# Patient Record
Sex: Male | Born: 1965 | Race: White | Hispanic: No | Marital: Married | Smoking: Never smoker
Health system: Southern US, Community
[De-identification: ages and names within clinical notes are randomized; demographics above are authoritative.]

## PROBLEM LIST (undated history)

## (undated) DIAGNOSIS — F32A Depression, unspecified: Secondary | ICD-10-CM

## (undated) DIAGNOSIS — IMO0001 Reserved for inherently not codable concepts without codable children: Secondary | ICD-10-CM

## (undated) DIAGNOSIS — K219 Gastro-esophageal reflux disease without esophagitis: Secondary | ICD-10-CM

## (undated) DIAGNOSIS — K5900 Constipation, unspecified: Secondary | ICD-10-CM

## (undated) DIAGNOSIS — E7800 Pure hypercholesterolemia, unspecified: Secondary | ICD-10-CM

## (undated) DIAGNOSIS — I10 Essential (primary) hypertension: Secondary | ICD-10-CM

## (undated) DIAGNOSIS — Z8739 Personal history of other diseases of the musculoskeletal system and connective tissue: Secondary | ICD-10-CM

## (undated) DIAGNOSIS — Z87442 Personal history of urinary calculi: Secondary | ICD-10-CM

## (undated) DIAGNOSIS — G4730 Sleep apnea, unspecified: Secondary | ICD-10-CM

## (undated) DIAGNOSIS — F329 Major depressive disorder, single episode, unspecified: Secondary | ICD-10-CM

## (undated) HISTORY — PX: ROTATOR CUFF REPAIR: SHX139

## (undated) HISTORY — PX: CYSTO: SHX6284

---

## 2004-06-30 ENCOUNTER — Emergency Department (HOSPITAL_COMMUNITY): Admission: AD | Admit: 2004-06-30 | Discharge: 2004-06-30 | Payer: Self-pay | Admitting: Family Medicine

## 2010-06-03 ENCOUNTER — Emergency Department (HOSPITAL_BASED_OUTPATIENT_CLINIC_OR_DEPARTMENT_OTHER)
Admission: EM | Admit: 2010-06-03 | Discharge: 2010-06-03 | Disposition: A | Discharge status: Home / Self Care | Payer: Managed Care, Other (non HMO) | Attending: Emergency Medicine | Admitting: Emergency Medicine

## 2010-06-03 DIAGNOSIS — L02419 Cutaneous abscess of limb, unspecified: Secondary | ICD-10-CM | POA: Insufficient documentation

## 2010-06-03 DIAGNOSIS — Z79899 Other long term (current) drug therapy: Secondary | ICD-10-CM | POA: Insufficient documentation

## 2010-06-03 DIAGNOSIS — I10 Essential (primary) hypertension: Secondary | ICD-10-CM | POA: Insufficient documentation

## 2010-06-03 DIAGNOSIS — E785 Hyperlipidemia, unspecified: Secondary | ICD-10-CM | POA: Insufficient documentation

## 2010-06-03 LAB — DIFFERENTIAL
Basophils Absolute: 0 10*3/uL (ref 0.0–0.1)
Eosinophils Relative: 1 % (ref 0–5)
Lymphocytes Relative: 12 % (ref 12–46)
Monocytes Absolute: 1.2 10*3/uL — ABNORMAL HIGH (ref 0.1–1.0)
Monocytes Relative: 8 % (ref 3–12)
Neutro Abs: 13.1 10*3/uL — ABNORMAL HIGH (ref 1.7–7.7)

## 2010-06-03 LAB — CBC
HCT: 42.9 % (ref 39.0–52.0)
Hemoglobin: 15.3 g/dL (ref 13.0–17.0)
MCH: 29 pg (ref 26.0–34.0)
MCHC: 35.7 g/dL (ref 30.0–36.0)
RDW: 12.9 % (ref 11.5–15.5)

## 2010-06-03 LAB — BASIC METABOLIC PANEL
BUN: 21 mg/dL (ref 6–23)
CO2: 28 mEq/L (ref 19–32)
Calcium: 9.5 mg/dL (ref 8.4–10.5)
Creatinine, Ser: 1 mg/dL (ref 0.4–1.5)
GFR calc non Af Amer: >60 mL/min (ref >60)
Glucose, Bld: 131 mg/dL — ABNORMAL HIGH (ref 70–99)

## 2011-10-01 ENCOUNTER — Other Ambulatory Visit: Payer: Self-pay | Admitting: Orthopedic Surgery

## 2011-10-01 DIAGNOSIS — M25511 Pain in right shoulder: Secondary | ICD-10-CM

## 2011-10-05 ENCOUNTER — Ambulatory Visit
Admission: RE | Admit: 2011-10-05 | Discharge: 2011-10-05 | Disposition: A | Discharge status: Home / Self Care | Payer: Managed Care, Other (non HMO) | Source: Ambulatory Visit | Attending: Orthopedic Surgery | Admitting: Orthopedic Surgery

## 2011-10-05 DIAGNOSIS — M25511 Pain in right shoulder: Secondary | ICD-10-CM

## 2012-02-08 ENCOUNTER — Emergency Department (HOSPITAL_BASED_OUTPATIENT_CLINIC_OR_DEPARTMENT_OTHER): Payer: Managed Care, Other (non HMO)

## 2012-02-08 ENCOUNTER — Emergency Department (HOSPITAL_BASED_OUTPATIENT_CLINIC_OR_DEPARTMENT_OTHER)
Admission: EM | Admit: 2012-02-08 | Discharge: 2012-02-08 | Disposition: A | Discharge status: Home / Self Care | Payer: Managed Care, Other (non HMO) | Attending: Emergency Medicine | Admitting: Emergency Medicine

## 2012-02-08 ENCOUNTER — Encounter (HOSPITAL_BASED_OUTPATIENT_CLINIC_OR_DEPARTMENT_OTHER): Payer: Self-pay | Admitting: *Deleted

## 2012-02-08 DIAGNOSIS — M1990 Unspecified osteoarthritis, unspecified site: Secondary | ICD-10-CM

## 2012-02-08 DIAGNOSIS — I10 Essential (primary) hypertension: Secondary | ICD-10-CM | POA: Insufficient documentation

## 2012-02-08 DIAGNOSIS — K219 Gastro-esophageal reflux disease without esophagitis: Secondary | ICD-10-CM | POA: Insufficient documentation

## 2012-02-08 DIAGNOSIS — M064 Inflammatory polyarthropathy: Secondary | ICD-10-CM | POA: Insufficient documentation

## 2012-02-08 DIAGNOSIS — F3289 Other specified depressive episodes: Secondary | ICD-10-CM | POA: Insufficient documentation

## 2012-02-08 DIAGNOSIS — F329 Major depressive disorder, single episode, unspecified: Secondary | ICD-10-CM | POA: Insufficient documentation

## 2012-02-08 DIAGNOSIS — Z79899 Other long term (current) drug therapy: Secondary | ICD-10-CM | POA: Insufficient documentation

## 2012-02-08 DIAGNOSIS — E7800 Pure hypercholesterolemia, unspecified: Secondary | ICD-10-CM | POA: Insufficient documentation

## 2012-02-08 DIAGNOSIS — Z7982 Long term (current) use of aspirin: Secondary | ICD-10-CM | POA: Insufficient documentation

## 2012-02-08 HISTORY — DX: Reserved for inherently not codable concepts without codable children: IMO0001

## 2012-02-08 HISTORY — DX: Depression, unspecified: F32.A

## 2012-02-08 HISTORY — DX: Major depressive disorder, single episode, unspecified: F32.9

## 2012-02-08 HISTORY — DX: Gastro-esophageal reflux disease without esophagitis: K21.9

## 2012-02-08 HISTORY — DX: Essential (primary) hypertension: I10

## 2012-02-08 HISTORY — DX: Pure hypercholesterolemia, unspecified: E78.00

## 2012-02-08 LAB — CBC WITH DIFFERENTIAL/PLATELET
Basophils Absolute: 0 10*3/uL (ref 0.0–0.1)
Basophils Relative: 0 % (ref 0–1)
Eosinophils Absolute: 0.1 10*3/uL (ref 0.0–0.7)
MCH: 29.5 pg (ref 26.0–34.0)
MCHC: 34.3 g/dL (ref 30.0–36.0)
Monocytes Relative: 10 % (ref 3–12)
Neutro Abs: 8.3 10*3/uL — ABNORMAL HIGH (ref 1.7–7.7)
Neutrophils Relative %: 72 % (ref 43–77)
Platelets: 225 10*3/uL (ref 150–400)
RDW: 13.8 % (ref 11.5–15.5)

## 2012-02-08 LAB — BASIC METABOLIC PANEL
BUN: 15 mg/dL (ref 6–23)
GFR calc Af Amer: >90 mL/min (ref >90)
GFR calc non Af Amer: >90 mL/min (ref >90)
Potassium: 4.2 mEq/L (ref 3.5–5.1)
Sodium: 137 mEq/L (ref 135–145)

## 2012-02-08 LAB — D-DIMER, QUANTITATIVE: D-Dimer, Quant: 0.32 ug/mL-FEU (ref 0.00–0.48)

## 2012-02-08 MED ORDER — HYDROCODONE-ACETAMINOPHEN 5-325 MG PO TABS
1.0000 | ORAL_TABLET | Freq: Once | ORAL | Status: AC
  Administered 2012-02-08: 1 via ORAL
  Filled 2012-02-08: qty 1

## 2012-02-08 MED ORDER — HYDROCODONE-ACETAMINOPHEN 5-325 MG PO TABS: 1.0000 | ORAL_TABLET | Freq: Four times a day (QID) | ORAL | Status: DC | PRN

## 2012-02-08 NOTE — ED Notes (Signed)
Pt currently in xray

## 2012-02-08 NOTE — ED Notes (Signed)
Pt states he woke up yest a.m. With right ankle pain. No known injury. Hurt worse while walking stairs. "[place] this [place] Concerned about clot. Swollen per pt.Nathan Moses

## 2012-02-08 NOTE — ED Notes (Signed)
Returned from xray

## 2012-02-08 NOTE — ED Provider Notes (Signed)
History  This chart was scribed for Shelda Jakes, MD by Erskine Emery, ED Scribe. This patient was seen in room MH03/MH03 and the patient's care was started at 18:48.   CSN: [id number]  Arrival date & time 02/08/12  5621   First MD Initiated Contact with Patient 02/08/12 1848      Chief Complaint  Patient presents with  . Ankle Pain    (Consider location/radiation/quality/duration/timing/severity/associated sxs/prior treatment) The history is provided by the patient. No language interpreter was used.  Nathan Moses is a 47 y.o. male who presents to the Emergency Department complaining of gradually worsening sharp right ankle pain that radiates up the calf since waking yesterday morning. Pt reports the pain is about a 3/10 when laying down but about a 10/10 when putting weight on it. Pt claims he did a lot of walking yesterday, which aggravates the pain. Pt reports some associated warmth in the ankle, diaphoresis, and nausea but denies any associated known injury to the area, chest pain, shortness of breath, emesis, diarrhea, dysuria, hematuria, headache, neck pain, or back pain. Pt has no personal or family h/o gout. Pt flew cross country this morning.  Past Medical History  Diagnosis Date  . Hypertension   . Hypercholesteremia   . Reflux   . Depression     Past Surgical History  Procedure Date  . Rotator cuff repair     History reviewed. No pertinent family history.  History  Substance Use Topics  . Smoking status: Never Smoker   . Smokeless tobacco: Not on file  . Alcohol Use: No      Review of Systems  Constitutional: Negative for fever and chills.  HENT: Negative for congestion and rhinorrhea.   Eyes: Negative for visual disturbance.  Respiratory: Negative for cough and shortness of breath.   Cardiovascular: Negative for chest pain.  Gastrointestinal: Negative for nausea, vomiting and diarrhea.  Genitourinary: Negative for dysuria and hematuria.   Musculoskeletal: Negative for back pain.       Right ankle pain and warmth.  Skin: Negative for rash.  Neurological: Negative for headaches.  Hematological: Does not bruise/bleed easily.  Psychiatric/Behavioral: Negative for sleep disturbance.  All other systems reviewed and are negative.    Allergies  Sulfa antibiotics  Home Medications   Current Outpatient Rx  Name  Route  Sig  Dispense  Refill  . ASPIRIN 81 MG PO TABS   Oral   Take 81 mg by mouth daily.         Marland Kitchen CITALOPRAM HYDROBROMIDE 40 MG PO TABS   Oral   Take 20 mg by mouth daily.          Marland Kitchen OMEPRAZOLE 40 MG PO CPDR   Oral   Take 20 mg by mouth daily.          Marland Kitchen SIMVASTATIN 20 MG PO TABS   Oral   Take 20 mg by mouth every evening.         . TRIAMTERENE 100 MG PO CAPS   Oral   Take 100 mg by mouth 2 (two) times daily.         . TRIAMTERENE-HCTZ 37.5-25 MG PO TABS   Oral   Take 1 tablet by mouth daily.         Marland Kitchen HYDROCODONE-ACETAMINOPHEN 5-325 MG PO TABS   Oral   Take 1-2 tablets by mouth every 6 (six) hours as needed for pain.   20 tablet   0     Triage Vitals:  BP 146/98  Pulse 96  Temp 98.3 F (36.8 C) (Oral)  Resp 20  Ht 5\' 9"  (1.753 m)  Wt 270 lb (122.471 kg)  BMI 39.87 kg/m2  SpO2 99%  Physical Exam  Nursing note and vitals reviewed. Constitutional: He is oriented to person, place, and time. He appears well-developed and well-nourished. No distress.  HENT:  Head: Normocephalic and atraumatic.  Eyes: EOM are normal. Pupils are equal, round, and reactive to light.  Neck: Neck supple. No tracheal deviation present.  Cardiovascular: Normal rate, regular rhythm and normal heart sounds.   No murmur heard.      DP pulse is 2+. Capillary refill is 2 seconds in right ankle.  Pulmonary/Chest: Effort normal and breath sounds normal. No respiratory distress. He has no wheezes.  Abdominal: Soft. Bowel sounds are normal. He exhibits no distension.  Musculoskeletal: Normal range of  motion.       Right ankle swelling and warmth with no redness, no tenderness to the calf, no sweling in the legs, and no swelling at the knee.  Neurological: He is alert and oriented to person, place, and time. No cranial nerve deficit. Coordination normal.  Skin: Skin is warm and dry.  Psychiatric: He has a normal mood and affect.    ED Course  Procedures (including critical care time) DIAGNOSTIC STUDIES: Oxygen Saturation is 99% on room air, normal by my interpretation.    COORDINATION OF CARE: 19:10--I evaluated the patient and we discussed a treatment plan including ankle x-ray to which the pt agreed.   20:08--I rechecked the pt and explained to him the results of his labs and x-ray.  Results for orders placed during the hospital encounter of 02/08/12  CBC WITH DIFFERENTIAL      Component Value Range   WBC 11.4 (*) 4.0 - 10.5 K/uL   RBC 4.95  4.22 - 5.81 MIL/uL   Hemoglobin 14.6  13.0 - 17.0 g/dL   HCT 16.1  09.6 - 04.5 %   MCV 86.1  78.0 - 100.0 fL   MCH 29.5  26.0 - 34.0 pg   MCHC 34.3  30.0 - 36.0 g/dL   RDW 40.9  81.1 - 91.4 %   Platelets 225  150 - 400 K/uL   Neutrophils Relative 72  43 - 77 %   Neutro Abs 8.3 (*) 1.7 - 7.7 K/uL   Lymphocytes Relative 17  12 - 46 %   Lymphs Abs 1.9  0.7 - 4.0 K/uL   Monocytes Relative 10  3 - 12 %   Monocytes Absolute 1.1 (*) 0.1 - 1.0 K/uL   Eosinophils Relative 1  0 - 5 %   Eosinophils Absolute 0.1  0.0 - 0.7 K/uL   Basophils Relative 0  0 - 1 %   Basophils Absolute 0.0  0.0 - 0.1 K/uL  BASIC METABOLIC PANEL      Component Value Range   Sodium 137  135 - 145 mEq/L   Potassium 4.2  3.5 - 5.1 mEq/L   Chloride 98  96 - 112 mEq/L   CO2 26  19 - 32 mEq/L   Glucose, Bld 135 (*) 70 - 99 mg/dL   BUN 15  6 - 23 mg/dL   Creatinine, Ser 7.82  0.50 - 1.35 mg/dL   Calcium 9.5  8.4 - 95.6 mg/dL   GFR calc non Af Amer >90  >90 mL/min   GFR calc Af Amer >90  >90 mL/min  D-DIMER, QUANTITATIVE  Component Value Range   D-Dimer, Quant  0.32  0.00 - 0.48 ug/mL-FEU   Dg Ankle Complete Right  02/08/2012  *RADIOLOGY REPORT*  Clinical Data: Right ankle pain.  Swelling.  No injury.  RIGHT ANKLE - COMPLETE 3+ VIEW  Comparison: None.  Findings: Mild medial and lateral malleolar soft tissue swelling is present in the ankle.  The alignment is anatomic.  The ankle mortise is congruent.  Ankle effusion is present.  There is no osteochondral lesion of the talar dome.  Small calcaneal spurs are incidentally noted.  IMPRESSION: Ankle swelling and effusion without osseous abnormality.   Original Report Authenticated By: Andreas Newport, M.D.      1. Inflammatory arthritis       MDM  Patient clinically with inflammatory arthritis most likely of the right ankle no injury no fracture seen on x-ray. Also d-dimer negative so not consistent with a DVT. Clinically not consistent with a DVT either. Patient will be treated with pain medications this may be gout gout is a septic joint patient has no history of previous arthritic or gout problems. Patient given precautions about septic joint. Orthopedic followup provided. Will give crutches as needed. Patient will return for followup with orthopedics for worse pain redness fevers spreading of redness.      I personally performed the services described in this documentation, which was scribed in my presence. The recorded information has been reviewed and is accurate.     Shelda Jakes, MD 02/08/12 2028

## 2012-11-05 ENCOUNTER — Encounter (INDEPENDENT_AMBULATORY_CARE_PROVIDER_SITE_OTHER): Payer: Self-pay | Admitting: Surgery

## 2012-11-05 ENCOUNTER — Ambulatory Visit (INDEPENDENT_AMBULATORY_CARE_PROVIDER_SITE_OTHER): Payer: Commercial Indemnity | Admitting: Surgery

## 2012-11-05 ENCOUNTER — Other Ambulatory Visit (INDEPENDENT_AMBULATORY_CARE_PROVIDER_SITE_OTHER): Payer: Self-pay

## 2012-11-05 DIAGNOSIS — K2100 Gastro-esophageal reflux disease with esophagitis, without bleeding: Secondary | ICD-10-CM

## 2012-11-05 DIAGNOSIS — Z6841 Body Mass Index (BMI) 40.0 and over, adult: Secondary | ICD-10-CM

## 2012-11-05 LAB — CBC WITH DIFFERENTIAL/PLATELET
Basophils Relative: 1 % (ref 0–1)
Eosinophils Absolute: 0.3 10*3/uL (ref 0.0–0.7)
Lymphs Abs: 1.9 10*3/uL (ref 0.7–4.0)
MCH: 29.4 pg (ref 26.0–34.0)
Neutro Abs: 5.6 10*3/uL (ref 1.7–7.7)
Neutrophils Relative %: 65 % (ref 43–77)
Platelets: 301 10*3/uL (ref 150–400)
RBC: 5.61 MIL/uL (ref 4.22–5.81)

## 2012-11-05 LAB — T4: T4, Total: 8.8 ug/dL (ref 5.0–12.5)

## 2012-11-05 NOTE — Patient Instructions (Signed)
Sleeve Gastrectomy A sleeve gastrectomy is an operation that removes a large portion of your stomach. This operation is performed to help you lose weight. You lose weight with this operation because it restricts the amount of food you can eat. Your stomach will be a narrow tube after the operation (the size of a banana). Your stomach will hold much less food than your normal stomach. Also, the portion of your stomach that is removed produces a hormone that causes hunger. You are a candidate for this operation if you have morbid obesity, defined as a body mass index (BMI) greater than 40. You may also be a candidate if you have severe obesity related diseases such as: diabetes mellitus 2, obstructive sleep apnea, or cardiopulmonary disease (heart and lung) with a BMI greater than 35. You will need to talk with your surgeon and insurance company to find out if this surgery is right for you.  Sleeve gastrectomy is a good alternative to other treatments of obesity (bariatric) operations. It does not require any adjustments after the operation compared with an adjustable gastric band. Also, it is safer than a gastric bypass. RISKS AND COMPLICATIONS Some of the problems that can occur from this procedure include:  Infection. A germ starts growing in the incision sites. This can usually be treated with antibiotics.  Bleeding. This can occur with any surgery. Your surgeon will take all precautions to minimize this risk.  Damage to tissue or organs in the area may occur. If there is excessive damage, the surgeon may need to change to an open surgery. In this case, one large incision will be made in the center of your abdomen.  Leakage. The fluid in your stomach may leak into the abdominal cavity. If this happens, you may need another surgery to fix the leak. BEFORE THE PROCEDURE Before your operation you will meet with your surgeon and their team for the treatment of obesity. Here you will find out if you are  a candidate for bariatric surgery. The risks and the benefits of the operation will be explained. You will also meet a:  Dietician who will guide you with your preoperative and postoperative diet.  An internal medical doctor to manage your obesity related illnesses.  A psychology team to help with cravings or other mental difficulties. In addition:  You will be directed to have certain lab work and x-rays performed.  You will schedule a special test called a manometry. This test evaluates your esophagus and how it moves.  You will be placed on a special liquid diet two to three weeks before your operation. This diet helps you lose weight before the operation and decrease the amount of fat in the abdomen. It makes the operation easier for the surgeon and safer for the patient. The dietician will share the details of this with you. Before your operation:  Make sure you follow your surgeon's instructions exactly. Stop or continue medications they recommend.  Do not eat or drink anything after midnight.  Arrive at the hospital 1 hour before your surgery for check in.  Shower the morning of your operation. PROCEDURE  Most sleeve gastrectomies are performed using a laparoscope. A laparoscope is a thin, lighted, pencil-sized tube. Once you are anesthetized (asleep), the surgeon inflates your belly (abdomen) with a gas (carbon dioxide) that makes room to operate. It also makes your organs easier to see. The laparoscope is inserted into the abdomen through a small incision. Other small instruments are inserted into the abdomen  through other small incisions. During the operation, the stomach is divided using a stapler. Part of the stomach is removed through one of the incisions. The remaining stomach is reinforced using a stitch (suture) and surgical glue to prevent leakage of the gastric contents. At the end of the procedure, the gas is removed from the inside of your abdomen. The incisions are closed  with stitches. These may be covered with a dressing or left open. Because the incisions are small, there is usually minimal discomfort. You will wake up in a recovery room. Once your anesthesia has worn off, you will be moved to your hospital room. AFTER THE PROCEDURE  You will stay in the hospital, on average, for two days.  You will be given pain medication and anti-nausea medication.  You may have a drain from one of the incisions in your abdomen. This drain will stay in place until your first postoperative visit.  The nursing staff will assist you in getting out of bed the day of, or one day after, your surgery.  You will start on a liquid diet, the first day after your operation. The dietician will recommend this diet.  Taking deep breaths and coughing is very important to avoid pneumonia. Document Released: 10/20/2008 Document Revised: 03/17/2011 Document Reviewed: 10/20/2008 [facility] Patient Information 2014 [place], [place].

## 2012-11-05 NOTE — Progress Notes (Signed)
Chief Complaint:  Morbid obesity BMI of 43  History of Present Illness:  Nathan Moses is an 47 y.o. male from [place] who comes in with his wife on whom I did a ventral hernia repair. They had seen a seminar done by Arlys John and he is interested in a sleeve gastrectomy. I discussed that operation along with the others that we did hear in some detail. He would like to go ahead and 4 with a sleeve gastrectomy. Indicated the risk that she seems to know a lot about. He does have GERD and takes a PPI.  Past Medical History  Diagnosis Date  . Hypertension   . Hypercholesteremia   . Reflux   . Depression     Past Surgical History  Procedure Laterality Date  . Rotator cuff repair      Current Outpatient Prescriptions  Medication Sig Dispense Refill  . aspirin 81 MG tablet Take 81 mg by mouth daily.      . citalopram (CELEXA) 40 MG tablet Take 20 mg by mouth daily.       Marland Kitchen omeprazole (PRILOSEC) 40 MG capsule Take 20 mg by mouth daily.       . simvastatin (ZOCOR) 20 MG tablet Take 20 mg by mouth every evening.      . triamterene-hydrochlorothiazide (MAXZIDE-25) 37.5-25 MG per tablet Take 1 tablet by mouth daily.       No current facility-administered medications for this visit.   Sulfa antibiotics Family History  Problem Relation Age of Onset  . Cancer Maternal Grandmother     breast   Social History:   reports that he has never smoked. He does not have any smokeless tobacco history on file. He reports that he does not drink alcohol or use illicit drugs.   REVIEW OF SYSTEMS - PERTINENT POSITIVES ONLY: Positive for previous shoulder surgery. He's never had any problem with anesthesia. Denies DVT  Physical Exam:   Blood pressure 142/88, pulse 72, temperature 97.6 F (36.4 C), temperature source Temporal, resp. rate 16, height 5' 8.5" (1.74 m), weight 285 lb 9.6 oz (129.547 kg). Body mass index is 42.79 kg/(m^2).  Gen:  WDWN white male NAD  Neurological: Alert and oriented to  person, place, and time. Motor and sensory function is grossly intact  Head: Normocephalic and atraumatic.  Eyes: Conjunctivae are normal. Pupils are equal, round, and reactive to light. No scleral icterus.  Neck: Normal range of motion. Neck supple. No tracheal deviation or thyromegaly present.  Cardiovascular:  SR without murmurs or gallops.  No carotid bruits Respiratory: Effort normal.  No respiratory distress. No chest wall tenderness. Breath sounds normal.  No wheezes, rales or rhonchi.  Abdomen:  Obese caring his weight and his abdomen although he does have broad shoulders GU: Musculoskeletal: Normal range of motion. Extremities are nontender. No cyanosis, edema or clubbing noted Lymphadenopathy: No cervical, preauricular, postauricular or axillary adenopathy is present Skin: Skin is warm and dry. No rash noted. No diaphoresis. No erythema. No pallor. Pscyh: Normal mood and affect. Behavior is normal. Judgment and thought content normal.   LABORATORY RESULTS: No results found for this or any previous visit (from the past 48 hour(s)).  RADIOLOGY RESULTS: No results found.  Problem List: There are no active problems to display for this patient.   Assessment & Plan: Morbid obesity will begin workup for sleeve gastrectomy    Matt B. Daphine Deutscher, MD, [facility] [phone] beeper [phone]  11/05/2012 11:57 AM

## 2012-11-19 ENCOUNTER — Encounter (HOSPITAL_COMMUNITY)
Admission: RE | Disposition: A | Discharge status: Home / Self Care | Payer: Self-pay | Source: Ambulatory Visit | Attending: Surgery

## 2012-11-19 ENCOUNTER — Ambulatory Visit (HOSPITAL_COMMUNITY)
Admission: RE | Admit: 2012-11-19 | Discharge: 2012-11-19 | Disposition: A | Discharge status: Home / Self Care | Payer: Managed Care, Other (non HMO) | Source: Ambulatory Visit | Attending: Surgery | Admitting: Surgery

## 2012-11-19 HISTORY — PX: BREATH TEK H PYLORI: SHX5422

## 2012-11-19 SURGERY — BREATH TEST, FOR HELICOBACTER PYLORI

## 2012-11-22 ENCOUNTER — Other Ambulatory Visit: Payer: Self-pay

## 2012-11-22 ENCOUNTER — Ambulatory Visit (HOSPITAL_COMMUNITY)
Admission: RE | Admit: 2012-11-22 | Discharge: 2012-11-22 | Disposition: A | Discharge status: Home / Self Care | Payer: Managed Care, Other (non HMO) | Source: Ambulatory Visit | Attending: Surgery | Admitting: Surgery

## 2012-11-22 ENCOUNTER — Encounter (HOSPITAL_COMMUNITY): Payer: Self-pay | Admitting: Surgery

## 2012-11-22 DIAGNOSIS — I10 Essential (primary) hypertension: Secondary | ICD-10-CM | POA: Insufficient documentation

## 2012-11-22 DIAGNOSIS — K449 Diaphragmatic hernia without obstruction or gangrene: Secondary | ICD-10-CM | POA: Insufficient documentation

## 2012-11-22 DIAGNOSIS — F3289 Other specified depressive episodes: Secondary | ICD-10-CM | POA: Insufficient documentation

## 2012-11-22 DIAGNOSIS — E7800 Pure hypercholesterolemia, unspecified: Secondary | ICD-10-CM | POA: Insufficient documentation

## 2012-11-22 DIAGNOSIS — K219 Gastro-esophageal reflux disease without esophagitis: Secondary | ICD-10-CM | POA: Insufficient documentation

## 2012-11-22 DIAGNOSIS — F329 Major depressive disorder, single episode, unspecified: Secondary | ICD-10-CM | POA: Insufficient documentation

## 2012-11-22 DIAGNOSIS — Z6841 Body Mass Index (BMI) 40.0 and over, adult: Secondary | ICD-10-CM | POA: Insufficient documentation

## 2012-12-06 ENCOUNTER — Encounter: Payer: Self-pay | Admitting: Dietician

## 2012-12-06 ENCOUNTER — Encounter: Payer: Managed Care, Other (non HMO) | Attending: Surgery | Admitting: Dietician

## 2012-12-06 DIAGNOSIS — Z713 Dietary counseling and surveillance: Secondary | ICD-10-CM | POA: Insufficient documentation

## 2012-12-06 NOTE — Progress Notes (Signed)
  Pre-Op Assessment Visit:  Pre-Operative Sleeve Gastrectomy Surgery  Medical Nutrition Therapy:  Appt start time: 1545   End time:  1615.  Patient was seen on 12/06/12 for Pre-Operative Sleeve Gastrecomty Nutrition Assessment. Assessment and letter of approval faxed to [facility] Bariatric Surgery Program coordinator on 12/06/12.   Handouts given during visit include:  Pre-Op Goals Bariatric Surgery Protein Shakes Holiday Eating and Recipes After Bariatric Surgery  Patient to call the [facility] to enroll in Pre-Op and Post-Op Nutrition Education when surgery date is scheduled.

## 2012-12-06 NOTE — Patient Instructions (Signed)
Patient to call the [facility] to enroll in Pre-Op and Post-Op Nutrition Education when surgery date is scheduled. 

## 2012-12-28 ENCOUNTER — Other Ambulatory Visit (INDEPENDENT_AMBULATORY_CARE_PROVIDER_SITE_OTHER): Payer: Self-pay | Admitting: Surgery

## 2013-01-10 ENCOUNTER — Encounter (HOSPITAL_COMMUNITY): Payer: Self-pay | Admitting: Pharmacy Technician

## 2013-01-10 ENCOUNTER — Ambulatory Visit (HOSPITAL_COMMUNITY)
Admission: RE | Admit: 2013-01-10 | Discharge: 2013-01-10 | Disposition: A | Discharge status: Home / Self Care | Payer: Managed Care, Other (non HMO) | Source: Ambulatory Visit | Attending: Surgery | Admitting: Surgery

## 2013-01-10 ENCOUNTER — Encounter (HOSPITAL_COMMUNITY)
Admission: RE | Disposition: A | Discharge status: Home / Self Care | Payer: Self-pay | Source: Ambulatory Visit | Attending: Surgery

## 2013-01-10 DIAGNOSIS — Z01818 Encounter for other preprocedural examination: Secondary | ICD-10-CM | POA: Insufficient documentation

## 2013-01-10 HISTORY — PX: BREATH TEK H PYLORI: SHX5422

## 2013-01-10 SURGERY — BREATH TEST, FOR HELICOBACTER PYLORI

## 2013-01-11 ENCOUNTER — Encounter (HOSPITAL_COMMUNITY): Payer: Self-pay | Admitting: Surgery

## 2013-01-13 ENCOUNTER — Ambulatory Visit: Payer: Managed Care, Other (non HMO)

## 2013-01-13 NOTE — Patient Instructions (Addendum)
Elmus Buice  01/13/2013                           YOUR PROCEDURE IS SCHEDULED ON: 01/18/13               PLEASE REPORT TO SHORT STAY CENTER AT : 8:45 am               CALL THIS NUMBER IF ANY PROBLEMS THE DAY OF SURGERY :               [phone]                      REMEMBER:   Do not eat food or drink liquids AFTER MIDNIGHT .  Take these medicines the morning of surgery with A SIP OF WATER: NONE   Do not wear jewelry, make-up   Do not wear lotions, powders, or perfumes.   Do not shave legs or underarms 12 hrs. before surgery (men may shave face)  Do not bring valuables to the hospital.  Contacts, dentures or bridgework may not be worn into surgery.  Leave suitcase in the car. After surgery it may be brought to your room.  For patients admitted to the hospital more than one night, checkout time is 11:00                          The day of discharge.   Patients discharged the day of surgery will not be allowed to drive home                             If going home same day of surgery, must have someone stay with you first                           24 hrs at home and arrange for some one to drive you home from hospital.    Special Instructions:   Please read over the following fact sheets that you were given:                     1. [place]                       2. BRING C PAP [place]                                                X_____________________________________________________________________        Failure to follow these instructions may result in cancellation of your surgery

## 2013-01-14 ENCOUNTER — Encounter (HOSPITAL_COMMUNITY)
Admission: RE | Admit: 2013-01-14 | Discharge: 2013-01-14 | Disposition: A | Discharge status: Home / Self Care | Payer: Managed Care, Other (non HMO) | Source: Ambulatory Visit | Attending: Surgery | Admitting: Surgery

## 2013-01-14 ENCOUNTER — Telehealth (INDEPENDENT_AMBULATORY_CARE_PROVIDER_SITE_OTHER): Payer: Self-pay

## 2013-01-14 ENCOUNTER — Encounter (HOSPITAL_COMMUNITY): Payer: Self-pay

## 2013-01-14 ENCOUNTER — Encounter (INDEPENDENT_AMBULATORY_CARE_PROVIDER_SITE_OTHER): Payer: Self-pay | Admitting: Surgery

## 2013-01-14 ENCOUNTER — Ambulatory Visit (INDEPENDENT_AMBULATORY_CARE_PROVIDER_SITE_OTHER): Payer: Commercial Indemnity | Admitting: Surgery

## 2013-01-14 VITALS — BP 128/82 | HR 80 | Temp 98.4°F | Resp 15 | Ht 69.0 in | Wt 280.4 lb

## 2013-01-14 DIAGNOSIS — K219 Gastro-esophageal reflux disease without esophagitis: Secondary | ICD-10-CM

## 2013-01-14 DIAGNOSIS — Z6841 Body Mass Index (BMI) 40.0 and over, adult: Secondary | ICD-10-CM

## 2013-01-14 DIAGNOSIS — Z01812 Encounter for preprocedural laboratory examination: Secondary | ICD-10-CM | POA: Insufficient documentation

## 2013-01-14 HISTORY — DX: Constipation, unspecified: K59.00

## 2013-01-14 HISTORY — DX: Personal history of urinary calculi: Z87.442

## 2013-01-14 HISTORY — DX: Sleep apnea, unspecified: G47.30

## 2013-01-14 HISTORY — DX: Personal history of other diseases of the musculoskeletal system and connective tissue: Z87.39

## 2013-01-14 LAB — CBC WITH DIFFERENTIAL/PLATELET
BASOS PCT: 1 % (ref 0–1)
Basophils Absolute: 0.1 10*3/uL (ref 0.0–0.1)
Eosinophils Absolute: 0.3 10*3/uL (ref 0.0–0.7)
Eosinophils Relative: 4 % (ref 0–5)
HEMATOCRIT: 47.1 % (ref 39.0–52.0)
HEMOGLOBIN: 16.7 g/dL (ref 13.0–17.0)
LYMPHS ABS: 2 10*3/uL (ref 0.7–4.0)
LYMPHS PCT: 28 % (ref 12–46)
MCH: 29.6 pg (ref 26.0–34.0)
MCHC: 35.5 g/dL (ref 30.0–36.0)
MCV: 83.4 fL (ref 78.0–100.0)
MONO ABS: 0.7 10*3/uL (ref 0.1–1.0)
MONOS PCT: 10 % (ref 3–12)
NEUTROS ABS: 4.1 10*3/uL (ref 1.7–7.7)
Neutrophils Relative %: 57 % (ref 43–77)
Platelets: 266 10*3/uL (ref 150–400)
RBC: 5.65 MIL/uL (ref 4.22–5.81)
RDW: 13.5 % (ref 11.5–15.5)
WBC: 7.2 10*3/uL (ref 4.0–10.5)

## 2013-01-14 LAB — COMPREHENSIVE METABOLIC PANEL
ALT: 37 U/L (ref 0–53)
AST: 30 U/L (ref 0–37)
Albumin: 4.3 g/dL (ref 3.5–5.2)
Alkaline Phosphatase: 64 U/L (ref 39–117)
BUN: 23 mg/dL (ref 6–23)
CO2: 29 meq/L (ref 19–32)
CREATININE: 1.12 mg/dL (ref 0.50–1.35)
Calcium: 9.9 mg/dL (ref 8.4–10.5)
Chloride: 95 mEq/L — ABNORMAL LOW (ref 96–112)
GFR, EST AFRICAN AMERICAN: 88 mL/min — AB (ref >90)
GFR, EST NON AFRICAN AMERICAN: 76 mL/min — AB (ref >90)
GLUCOSE: 110 mg/dL — AB (ref 70–99)
POTASSIUM: 3.8 meq/L (ref 3.7–5.3)
Sodium: 137 mEq/L (ref 137–147)
Total Bilirubin: 0.5 mg/dL (ref 0.3–1.2)
Total Protein: 7.5 g/dL (ref 6.0–8.3)

## 2013-01-14 NOTE — Telephone Encounter (Signed)
Pt given consent form. Signed and in chart. Pt advised clear liquids only 48 hrs prior to surgery.

## 2013-01-14 NOTE — Patient Instructions (Signed)

## 2013-01-14 NOTE — Progress Notes (Signed)
Chief Complaint:  Morbid obesity BMI of 43  History of Present Illness:  Nathan Moses is an 48 y.o. male from [place] who came in initially with his wife on whom I did a ventral hernia repair. They had seen a seminar done by Aaron Edelman and he is interested in a sleeve gastrectomy. I discussed that operation along with the others that we did hear in some detail. He would like to go ahead and 4 with a sleeve gastrectomy. Indicated the risk that she seems to know a lot about. He does have GERD and takes a PPI.  His upper GI showed a small hiatal hernia with GER.  Will look at and repair.    Past Medical History  Diagnosis Date  . Hypertension   . Hypercholesteremia   . Reflux   . Depression   . History of gout   . History of kidney stones   . Sleep apnea     uses c pap  . Constipation     Past Surgical History  Procedure Laterality Date  . Rotator cuff repair    . Breath tek h pylori N/A 11/19/2012    Procedure: BREATH TEK H PYLORI;  Surgeon: Pedro Earls, MD;  Location: [facility];  Service: General;  Laterality: N/A;  . Breath tek h pylori N/A 01/10/2013    Procedure: BREATH TEK H PYLORI;  Surgeon: Pedro Earls, MD;  Location: [facility];  Service: General;  Laterality: N/A;  . Cysto      Current Outpatient Prescriptions  Medication Sig Dispense Refill  . citalopram (CELEXA) 40 MG tablet Take 40 mg by mouth every evening.      . simvastatin (ZOCOR) 20 MG tablet Take 20 mg by mouth every evening.      . triamterene-hydrochlorothiazide (MAXZIDE-25) 37.5-25 MG per tablet Take 1 tablet by mouth every evening.       Marland Kitchen aspirin 81 MG tablet Take 81 mg by mouth daily.      Marland Kitchen omeprazole (PRILOSEC) 40 MG capsule Take 40 mg by mouth daily.        No current facility-administered medications for this visit.   Sulfa antibiotics Family History  Problem Relation Age of Onset  . Cancer Maternal Grandmother     breast   Social History:   reports that he has never smoked. He does  not have any smokeless tobacco history on file. He reports that he does not drink alcohol or use illicit drugs.   REVIEW OF SYSTEMS - PERTINENT POSITIVES ONLY: Positive for previous shoulder surgery. He's never had any problem with anesthesia. Denies DVT  I gave him a note to excuse him from his gym from 01/18/13 to 02/06/13.    Physical Exam:   Blood pressure 128/82, pulse 80, temperature 98.4 F (36.9 C), temperature source Temporal, resp. rate 15, height $RemoveBe'5\' 9"'LzqpCmcvk$  (1.753 m), weight 280 lb 6.4 oz (127.189 kg). Body mass index is 41.39 kg/(m^2).  Gen:  WDWN white male NAD  Neurological: Alert and oriented to person, place, and time. Motor and sensory function is grossly intact  Head: Normocephalic and atraumatic.  Eyes: Conjunctivae are normal. Pupils are equal, round, and reactive to light. No scleral icterus.  Neck: Normal range of motion. Neck supple. No tracheal deviation or thyromegaly present.  Cardiovascular:  SR without murmurs or gallops.  No carotid bruits Respiratory: Effort normal.  No respiratory distress. No chest wall tenderness. Breath sounds normal.  No wheezes, rales or rhonchi.  Abdomen:  Obese caring his  weight and his abdomen although he does have broad shoulders GU: Musculoskeletal: Normal range of motion. Extremities are nontender. No cyanosis, edema or clubbing noted Lymphadenopathy: No cervical, preauricular, postauricular or axillary adenopathy is present Skin: Skin is warm and dry. No rash noted. No diaphoresis. No erythema. No pallor. Pscyh: Normal mood and affect. Behavior is normal. Judgment and thought content normal.   LABORATORY RESULTS: Results for orders placed during the hospital encounter of 01/14/13 (from the past 48 hour(s))  CBC WITH DIFFERENTIAL     Status: None   Collection Time    01/14/13  8:30 AM      Result Value Range   WBC 7.2  4.0 - 10.5 K/uL   RBC 5.65  4.22 - 5.81 MIL/uL   Hemoglobin 16.7  13.0 - 17.0 g/dL   HCT 47.1  39.0 - 52.0 %   MCV  83.4  78.0 - 100.0 fL   MCH 29.6  26.0 - 34.0 pg   MCHC 35.5  30.0 - 36.0 g/dL   RDW 13.5  11.5 - 15.5 %   Platelets 266  150 - 400 K/uL   Neutrophils Relative % 57  43 - 77 %   Neutro Abs 4.1  1.7 - 7.7 K/uL   Lymphocytes Relative 28  12 - 46 %   Lymphs Abs 2.0  0.7 - 4.0 K/uL   Monocytes Relative 10  3 - 12 %   Monocytes Absolute 0.7  0.1 - 1.0 K/uL   Eosinophils Relative 4  0 - 5 %   Eosinophils Absolute 0.3  0.0 - 0.7 K/uL   Basophils Relative 1  0 - 1 %   Basophils Absolute 0.1  0.0 - 0.1 K/uL  COMPREHENSIVE METABOLIC PANEL     Status: Abnormal   Collection Time    01/14/13  8:30 AM      Result Value Range   Sodium 137  137 - 147 mEq/L   Potassium 3.8  3.7 - 5.3 mEq/L   Chloride 95 (*) 96 - 112 mEq/L   CO2 29  19 - 32 mEq/L   Glucose, Bld 110 (*) 70 - 99 mg/dL   BUN 23  6 - 23 mg/dL   Creatinine, Ser 1.12  0.50 - 1.35 mg/dL   Calcium 9.9  8.4 - 10.5 mg/dL   Total Protein 7.5  6.0 - 8.3 g/dL   Albumin 4.3  3.5 - 5.2 g/dL   AST 30  0 - 37 U/L   ALT 37  0 - 53 U/L   Alkaline Phosphatase 64  39 - 117 U/L   Total Bilirubin 0.5  0.3 - 1.2 mg/dL   GFR calc non Af Amer 76 (*) >90 mL/min   GFR calc Af Amer 88 (*) >90 mL/min   Comment: (NOTE)     The eGFR has been calculated using the CKD EPI equation.     This calculation has not been validated in all clinical situations.     eGFR's persistently <90 mL/min signify possible Chronic Kidney     Disease.    RADIOLOGY RESULTS: No results found.  Problem List: Patient Active Problem List   Diagnosis Date Noted  . Morbid obesity 11/05/2012    Assessment & Plan: Laparoscopic sleeve gastrectomy and repair of hiatus hernia    Matt B. Hassell Done, MD, [facility] [phone] beeper [phone]  01/14/2013 11:48 AM

## 2013-01-14 NOTE — Progress Notes (Signed)
Pre-Operative Nutrition Class:  Appt start time: 1730   End time:  1930  Patient was seen on 01/14/2012 for Pre-Operative Bariatric Surgery Education at [facility].   Surgery date: 01/18/2013 Surgery type: Gastric Sleeve  The following the learning objectives were met by the patient during this course:  Identify Pre-Op Dietary Goals and will begin 2 weeks pre-operatively  Identify appropriate sources of fluids and proteins   State protein recommendations and appropriate sources pre and post-operatively  Identify Post-Operative Dietary Goals and will follow for 2 weeks post-operatively  Identify appropriate multivitamin and calcium sources  Describe the need for physical activity post-operatively and will follow MD recommendations  State when to call healthcare provider regarding medication questions or post-operative complications  Handouts given during class include:  Pre-Op Bariatric Surgery Diet Handout  Protein Shake Handout  Post-Op Bariatric Surgery Nutrition Handout  [facility] Program Information Flyer  Support Group Information Flyer  [facility] Bariatric Supplements Price List  Follow-Up Plan: Patient will follow-up at [facility] 2 weeks post operatively for diet advancement per MD.

## 2013-01-18 ENCOUNTER — Encounter (HOSPITAL_COMMUNITY): Payer: Self-pay

## 2013-01-18 ENCOUNTER — Encounter (HOSPITAL_COMMUNITY)
Admission: RE | Disposition: A | Discharge status: Home / Self Care | Payer: Self-pay | Source: Ambulatory Visit | Attending: Surgery

## 2013-01-18 ENCOUNTER — Inpatient Hospital Stay (HOSPITAL_COMMUNITY): Payer: Managed Care, Other (non HMO) | Admitting: Anesthesiology

## 2013-01-18 ENCOUNTER — Encounter (HOSPITAL_COMMUNITY): Payer: Managed Care, Other (non HMO) | Admitting: Anesthesiology

## 2013-01-18 ENCOUNTER — Inpatient Hospital Stay (HOSPITAL_COMMUNITY)
Admission: RE | Admit: 2013-01-18 | Discharge: 2013-01-20 | DRG: 621 | Disposition: A | Discharge status: Home / Self Care | Payer: Managed Care, Other (non HMO) | Source: Ambulatory Visit | Attending: Surgery | Admitting: Surgery

## 2013-01-18 DIAGNOSIS — K449 Diaphragmatic hernia without obstruction or gangrene: Secondary | ICD-10-CM | POA: Diagnosis present

## 2013-01-18 DIAGNOSIS — E7800 Pure hypercholesterolemia, unspecified: Secondary | ICD-10-CM | POA: Diagnosis present

## 2013-01-18 DIAGNOSIS — Z9884 Bariatric surgery status: Secondary | ICD-10-CM

## 2013-01-18 DIAGNOSIS — Z7982 Long term (current) use of aspirin: Secondary | ICD-10-CM

## 2013-01-18 DIAGNOSIS — I10 Essential (primary) hypertension: Secondary | ICD-10-CM

## 2013-01-18 DIAGNOSIS — Z87442 Personal history of urinary calculi: Secondary | ICD-10-CM

## 2013-01-18 DIAGNOSIS — G4730 Sleep apnea, unspecified: Secondary | ICD-10-CM | POA: Diagnosis present

## 2013-01-18 DIAGNOSIS — K2100 Gastro-esophageal reflux disease with esophagitis, without bleeding: Secondary | ICD-10-CM

## 2013-01-18 DIAGNOSIS — K219 Gastro-esophageal reflux disease without esophagitis: Secondary | ICD-10-CM | POA: Diagnosis present

## 2013-01-18 DIAGNOSIS — F329 Major depressive disorder, single episode, unspecified: Secondary | ICD-10-CM | POA: Diagnosis present

## 2013-01-18 DIAGNOSIS — Z6841 Body Mass Index (BMI) 40.0 and over, adult: Secondary | ICD-10-CM

## 2013-01-18 DIAGNOSIS — Z01812 Encounter for preprocedural laboratory examination: Secondary | ICD-10-CM

## 2013-01-18 DIAGNOSIS — F3289 Other specified depressive episodes: Secondary | ICD-10-CM | POA: Diagnosis present

## 2013-01-18 HISTORY — PX: LAPAROSCOPIC GASTRIC SLEEVE RESECTION: SHX5895

## 2013-01-18 HISTORY — PX: HIATAL HERNIA REPAIR: SHX195

## 2013-01-18 LAB — CBC
HCT: 44.6 % (ref 39.0–52.0)
Hemoglobin: 15.8 g/dL (ref 13.0–17.0)
MCH: 29.5 pg (ref 26.0–34.0)
MCHC: 35.4 g/dL (ref 30.0–36.0)
MCV: 83.2 fL (ref 78.0–100.0)
PLATELETS: 239 10*3/uL (ref 150–400)
RBC: 5.36 MIL/uL (ref 4.22–5.81)
RDW: 13.2 % (ref 11.5–15.5)
WBC: 12.9 10*3/uL — AB (ref 4.0–10.5)

## 2013-01-18 LAB — CREATININE, SERUM
CREATININE: 1.06 mg/dL (ref 0.50–1.35)
GFR calc non Af Amer: 81 mL/min — ABNORMAL LOW (ref >90)

## 2013-01-18 SURGERY — GASTRECTOMY, SLEEVE, LAPAROSCOPIC
Anesthesia: General | Site: Abdomen

## 2013-01-18 MED ORDER — PROPOFOL 10 MG/ML IV BOLUS
INTRAVENOUS | Status: AC
  Filled 2013-01-18: qty 20

## 2013-01-18 MED ORDER — OXYCODONE HCL 5 MG PO TABS: 5.0000 mg | ORAL_TABLET | Freq: Once | ORAL | Status: DC | PRN

## 2013-01-18 MED ORDER — KCL IN DEXTROSE-NACL 20-5-0.45 MEQ/L-%-% IV SOLN
INTRAVENOUS | Status: DC
Start: 2013-01-18 — End: 2013-01-18

## 2013-01-18 MED ORDER — MORPHINE SULFATE 2 MG/ML IJ SOLN
2.0000 mg | INTRAMUSCULAR | Status: DC | PRN
Start: 2013-01-18 — End: 2013-01-20
  Administered 2013-01-18 (×2): 4 mg via INTRAVENOUS
  Administered 2013-01-19: 2 mg via INTRAVENOUS
  Administered 2013-01-19 (×2): 4 mg via INTRAVENOUS
  Administered 2013-01-19: 2 mg via INTRAVENOUS
  Filled 2013-01-18 (×2): qty 2
  Filled 2013-01-18: qty 1
  Filled 2013-01-18: qty 2
  Filled 2013-01-18: qty 1
  Filled 2013-01-18: qty 2

## 2013-01-18 MED ORDER — UNJURY CHOCOLATE CLASSIC POWDER
2.0000 [oz_av] | Freq: Four times a day (QID) | ORAL | Status: DC
  Administered 2013-01-20: 2 [oz_av] via ORAL

## 2013-01-18 MED ORDER — ONDANSETRON HCL 4 MG/2ML IJ SOLN
INTRAMUSCULAR | Status: DC | PRN
  Administered 2013-01-18: 4 mg via INTRAVENOUS

## 2013-01-18 MED ORDER — MIDAZOLAM HCL 5 MG/5ML IJ SOLN
INTRAMUSCULAR | Status: DC | PRN
  Administered 2013-01-18: 2 mg via INTRAVENOUS

## 2013-01-18 MED ORDER — HYDROMORPHONE HCL PF 1 MG/ML IJ SOLN
INTRAMUSCULAR | Status: DC | PRN
  Administered 2013-01-18: 0.5 mg via INTRAVENOUS
  Administered 2013-01-18: 1 mg via INTRAVENOUS

## 2013-01-18 MED ORDER — PHENYLEPHRINE HCL 10 MG/ML IJ SOLN
INTRAMUSCULAR | Status: DC | PRN
  Administered 2013-01-18: 80 ug via INTRAVENOUS

## 2013-01-18 MED ORDER — MEPERIDINE HCL 50 MG/ML IJ SOLN: 6.2500 mg | INTRAMUSCULAR | Status: DC | PRN

## 2013-01-18 MED ORDER — OXYCODONE-ACETAMINOPHEN 5-325 MG/5ML PO SOLN
5.0000 mL | ORAL | Status: DC | PRN
  Administered 2013-01-19 – 2013-01-20 (×4): 5 mL via ORAL
  Filled 2013-01-18 (×4): qty 5

## 2013-01-18 MED ORDER — GLYCOPYRROLATE 0.2 MG/ML IJ SOLN
INTRAMUSCULAR | Status: AC
  Filled 2013-01-18: qty 4

## 2013-01-18 MED ORDER — OXYCODONE HCL 5 MG/5ML PO SOLN
5.0000 mg | Freq: Once | ORAL | Status: DC | PRN
  Filled 2013-01-18: qty 5

## 2013-01-18 MED ORDER — LACTATED RINGERS IR SOLN
Status: DC | PRN
  Administered 2013-01-18: 3000 mL

## 2013-01-18 MED ORDER — UNJURY CHICKEN SOUP POWDER: 2.0000 [oz_av] | Freq: Four times a day (QID) | ORAL | Status: DC

## 2013-01-18 MED ORDER — TISSEEL VH 10 ML EX KIT
PACK | CUTANEOUS | Status: AC
  Filled 2013-01-18: qty 2

## 2013-01-18 MED ORDER — UNJURY VANILLA POWDER: 2.0000 [oz_av] | Freq: Four times a day (QID) | ORAL | Status: DC

## 2013-01-18 MED ORDER — GLYCOPYRROLATE 0.2 MG/ML IJ SOLN
INTRAMUSCULAR | Status: DC | PRN
  Administered 2013-01-18: .8 mg via INTRAVENOUS

## 2013-01-18 MED ORDER — FENTANYL CITRATE 0.05 MG/ML IJ SOLN
INTRAMUSCULAR | Status: DC | PRN
  Administered 2013-01-18: 100 ug via INTRAVENOUS
  Administered 2013-01-18: 50 ug via INTRAVENOUS
  Administered 2013-01-18: 100 ug via INTRAVENOUS

## 2013-01-18 MED ORDER — HYDROMORPHONE HCL PF 2 MG/ML IJ SOLN
INTRAMUSCULAR | Status: AC
  Filled 2013-01-18: qty 1

## 2013-01-18 MED ORDER — NEOSTIGMINE METHYLSULFATE 1 MG/ML IJ SOLN
INTRAMUSCULAR | Status: DC | PRN
  Administered 2013-01-18: 5 mg via INTRAVENOUS

## 2013-01-18 MED ORDER — DEXAMETHASONE SODIUM PHOSPHATE 10 MG/ML IJ SOLN
INTRAMUSCULAR | Status: DC | PRN
  Administered 2013-01-18: 10 mg via INTRAVENOUS

## 2013-01-18 MED ORDER — ACETAMINOPHEN 160 MG/5ML PO SOLN
650.0000 mg | ORAL | Status: DC | PRN
  Administered 2013-01-20: 650 mg via ORAL
  Filled 2013-01-18: qty 20.3

## 2013-01-18 MED ORDER — HYDROMORPHONE HCL PF 1 MG/ML IJ SOLN
INTRAMUSCULAR | Status: AC
  Filled 2013-01-18: qty 1

## 2013-01-18 MED ORDER — CISATRACURIUM BESYLATE (PF) 10 MG/5ML IV SOLN
INTRAVENOUS | Status: DC | PRN
  Administered 2013-01-18: 8 mg via INTRAVENOUS
  Administered 2013-01-18: 4 mg via INTRAVENOUS

## 2013-01-18 MED ORDER — CISATRACURIUM BESYLATE 20 MG/10ML IV SOLN
INTRAVENOUS | Status: AC
  Filled 2013-01-18: qty 10

## 2013-01-18 MED ORDER — BUPIVACAINE LIPOSOME 1.3 % IJ SUSP
INTRAMUSCULAR | Status: DC | PRN
  Administered 2013-01-18: 20 mL

## 2013-01-18 MED ORDER — HYDROMORPHONE HCL PF 1 MG/ML IJ SOLN
0.2500 mg | INTRAMUSCULAR | Status: DC | PRN
  Administered 2013-01-18 (×4): 0.5 mg via INTRAVENOUS

## 2013-01-18 MED ORDER — BUPIVACAINE LIPOSOME 1.3 % IJ SUSP
20.0000 mL | Freq: Once | INTRAMUSCULAR | Status: DC
  Filled 2013-01-18: qty 20

## 2013-01-18 MED ORDER — ONDANSETRON HCL 4 MG/2ML IJ SOLN
INTRAMUSCULAR | Status: AC
  Filled 2013-01-18: qty 2

## 2013-01-18 MED ORDER — PROMETHAZINE HCL 25 MG/ML IJ SOLN
6.2500 mg | INTRAMUSCULAR | Status: DC | PRN
  Administered 2013-01-18: 6.25 mg via INTRAVENOUS

## 2013-01-18 MED ORDER — KCL IN DEXTROSE-NACL 20-5-0.45 MEQ/L-%-% IV SOLN
INTRAVENOUS | Status: DC
  Administered 2013-01-18 – 2013-01-20 (×4): via INTRAVENOUS
  Filled 2013-01-18 (×6): qty 1000

## 2013-01-18 MED ORDER — HEPARIN SODIUM (PORCINE) 5000 UNIT/ML IJ SOLN
5000.0000 [IU] | Freq: Three times a day (TID) | INTRAMUSCULAR | Status: DC
  Administered 2013-01-18 – 2013-01-20 (×5): 5000 [IU] via SUBCUTANEOUS
  Filled 2013-01-18 (×8): qty 1

## 2013-01-18 MED ORDER — TISSEEL VH 10 ML EX KIT
PACK | CUTANEOUS | Status: DC | PRN
  Administered 2013-01-18: 1

## 2013-01-18 MED ORDER — LACTATED RINGERS IV SOLN
INTRAVENOUS | Status: DC | PRN
  Administered 2013-01-18 (×3): via INTRAVENOUS

## 2013-01-18 MED ORDER — PROMETHAZINE HCL 25 MG/ML IJ SOLN
INTRAMUSCULAR | Status: AC
  Filled 2013-01-18: qty 1

## 2013-01-18 MED ORDER — DEXAMETHASONE SODIUM PHOSPHATE 10 MG/ML IJ SOLN
INTRAMUSCULAR | Status: AC
  Filled 2013-01-18: qty 1

## 2013-01-18 MED ORDER — SUCCINYLCHOLINE CHLORIDE 20 MG/ML IJ SOLN
INTRAMUSCULAR | Status: DC | PRN
  Administered 2013-01-18: 100 mg via INTRAVENOUS

## 2013-01-18 MED ORDER — PHENYLEPHRINE 40 MCG/ML (10ML) SYRINGE FOR IV PUSH (FOR BLOOD PRESSURE SUPPORT)
PREFILLED_SYRINGE | INTRAVENOUS | Status: AC
  Filled 2013-01-18: qty 10

## 2013-01-18 MED ORDER — LACTATED RINGERS IV SOLN
INTRAVENOUS | Status: DC
  Administered 2013-01-18: 1000 mL via INTRAVENOUS

## 2013-01-18 MED ORDER — PROPOFOL 10 MG/ML IV BOLUS
INTRAVENOUS | Status: DC | PRN
  Administered 2013-01-18: 200 mg via INTRAVENOUS

## 2013-01-18 MED ORDER — SODIUM CHLORIDE 0.9 % IJ SOLN
INTRAMUSCULAR | Status: AC
  Filled 2013-01-18: qty 10

## 2013-01-18 MED ORDER — DEXTROSE 5 % IV SOLN
2.0000 g | INTRAVENOUS | Status: AC
  Administered 2013-01-18: 2 g via INTRAVENOUS
  Filled 2013-01-18: qty 2

## 2013-01-18 MED ORDER — FENTANYL CITRATE 0.05 MG/ML IJ SOLN
INTRAMUSCULAR | Status: AC
  Filled 2013-01-18: qty 5

## 2013-01-18 MED ORDER — HEPARIN SODIUM (PORCINE) 5000 UNIT/ML IJ SOLN
5000.0000 [IU] | INTRAMUSCULAR | Status: AC
  Administered 2013-01-18: 5000 [IU] via SUBCUTANEOUS
  Filled 2013-01-18: qty 1

## 2013-01-18 MED ORDER — MIDAZOLAM HCL 2 MG/2ML IJ SOLN
INTRAMUSCULAR | Status: AC
  Filled 2013-01-18: qty 2

## 2013-01-18 MED ORDER — ONDANSETRON HCL 4 MG/2ML IJ SOLN
4.0000 mg | INTRAMUSCULAR | Status: DC | PRN
Start: 2013-01-18 — End: 2013-01-20
  Administered 2013-01-18: 4 mg via INTRAVENOUS
  Filled 2013-01-18: qty 2

## 2013-01-18 SURGICAL SUPPLY — 69 items
ADH SKN CLS APL DERMABOND .7 (GAUZE/BANDAGES/DRESSINGS) ×4
APL SRG 32X5 SNPLK LF DISP (MISCELLANEOUS) ×2
APPLICATOR COTTON TIP 6IN STRL (MISCELLANEOUS) IMPLANT
APPLIER CLIP ROT 10 11.4 M/L (STAPLE) ×3
APPLIER CLIP ROT 13.4 12 LRG (CLIP)
APR CLP LRG 13.4X12 ROT 20 MLT (CLIP)
APR CLP MED LRG 11.4X10 (STAPLE) ×2
BLADE HEX COATED 2.75 (ELECTRODE) ×3 IMPLANT
BLADE SURG 15 STRL LF DISP TIS (BLADE) ×2 IMPLANT
BLADE SURG 15 STRL SS (BLADE) ×3
CABLE HIGH FREQUENCY MONO STRZ (ELECTRODE) IMPLANT
CLIP APPLIE ROT 10 11.4 M/L (STAPLE) IMPLANT
CLIP APPLIE ROT 13.4 12 LRG (CLIP) IMPLANT
DERMABOND ADVANCED (GAUZE/BANDAGES/DRESSINGS) ×2
DERMABOND ADVANCED .7 DNX12 (GAUZE/BANDAGES/DRESSINGS) IMPLANT
DEVICE SUT QUICK LOAD TK 5 (STAPLE) ×2 IMPLANT
DEVICE SUT TI-KNOT TK 5X26 (MISCELLANEOUS) ×1 IMPLANT
DEVICE SUTURE ENDOST 10MM (ENDOMECHANICALS) ×1 IMPLANT
DEVICE TROCAR PUNCTURE CLOSURE (ENDOMECHANICALS) ×1 IMPLANT
DISSECTOR BLUNT TIP ENDO 5MM (MISCELLANEOUS) ×1 IMPLANT
DRAIN CHANNEL 19F RND (DRAIN) IMPLANT
DRAPE CAMERA CLOSED 9X96 (DRAPES) ×3 IMPLANT
ELECT REM PT RETURN 9FT ADLT (ELECTROSURGICAL) ×3
ELECTRODE REM PT RTRN 9FT ADLT (ELECTROSURGICAL) ×2 IMPLANT
EVACUATOR SILICONE 100CC (DRAIN) IMPLANT
GLOVE BIOGEL M 8.0 STRL (GLOVE) ×3 IMPLANT
GOWN STRL REUS W/TWL XL LVL3 (GOWN DISPOSABLE) ×15 IMPLANT
HANDLE STAPLE EGIA 4 XL (STAPLE) IMPLANT
HOVERMATT SINGLE USE (MISCELLANEOUS) ×3 IMPLANT
KIT BASIN OR (CUSTOM PROCEDURE TRAY) ×3 IMPLANT
NDL SPNL 22GX3.5 QUINCKE BK (NEEDLE) ×2 IMPLANT
NEEDLE SPNL 22GX3.5 QUINCKE BK (NEEDLE) ×3 IMPLANT
PACK UNIVERSAL I (CUSTOM PROCEDURE TRAY) ×3 IMPLANT
PENCIL BUTTON HOLSTER BLD 10FT (ELECTRODE) ×3 IMPLANT
RELOAD BLUE (STAPLE) ×3 IMPLANT
RELOAD ENDO STITCH (ENDOMECHANICALS) ×6 IMPLANT
RELOAD GOLD (STAPLE) ×1 IMPLANT
RELOAD GREEN (STAPLE) ×1 IMPLANT
RELOAD SUT TRIPLE-STITCH 2-0 (ENDOMECHANICALS) IMPLANT
SCISSORS LAP 5X45 EPIX DISP (ENDOMECHANICALS) IMPLANT
SCRUB PCMX 4 OZ (MISCELLANEOUS) ×5 IMPLANT
SEALANT SURGICAL APPL DUAL CAN (MISCELLANEOUS) ×3 IMPLANT
SET IRRIG TUBING LAPAROSCOPIC (IRRIGATION / IRRIGATOR) ×3 IMPLANT
SHEARS CURVED HARMONIC AC 45CM (MISCELLANEOUS) ×3 IMPLANT
SLEEVE ADV FIXATION 12X100MM (TROCAR) IMPLANT
SLEEVE ADV FIXATION 5X100MM (TROCAR) ×3 IMPLANT
SLEEVE GASTRECTOMY 36FR VISIGI (MISCELLANEOUS) ×4 IMPLANT
SOLUTION ANTI FOG 6CC (MISCELLANEOUS) ×3 IMPLANT
SPONGE GAUZE 4X4 12PLY (GAUZE/BANDAGES/DRESSINGS) IMPLANT
SPONGE LAP 18X18 X RAY DECT (DISPOSABLE) ×3 IMPLANT
STAPLE ECHEON FLEX 60 POW ENDO (STAPLE) ×1 IMPLANT
SUT ETHILON 2 0 PS N (SUTURE) IMPLANT
SUT VIC AB 4-0 SH 18 (SUTURE) ×3 IMPLANT
SUT VICRYL 0 UR6 27IN ABS (SUTURE) ×1 IMPLANT
SYR 20CC LL (SYRINGE) ×3 IMPLANT
SYR 50ML LL SCALE MARK (SYRINGE) ×3 IMPLANT
TOWEL OR 17X26 10 PK STRL BLUE (TOWEL DISPOSABLE) ×6 IMPLANT
TOWEL OR NON WOVEN STRL DISP B (DISPOSABLE) ×3 IMPLANT
TRAY FOLEY CATH 14FRSI W/METER (CATHETERS) ×3 IMPLANT
TROCAR ADV FIXATION 12X100MM (TROCAR) ×3 IMPLANT
TROCAR ADV FIXATION 5X100MM (TROCAR) ×3 IMPLANT
TROCAR BLADELESS 15MM (ENDOMECHANICALS) ×3 IMPLANT
TROCAR XCEL 12X100 BLDLESS (ENDOMECHANICALS) ×1 IMPLANT
TROCAR XCEL NON-BLD 11X100MML (ENDOMECHANICALS) ×1 IMPLANT
TROCAR XCEL NON-BLD 5MMX100MML (ENDOMECHANICALS) ×3 IMPLANT
TUBING CONNECTING 10 (TUBING) ×3 IMPLANT
TUBING ENDO SMARTCAP (MISCELLANEOUS) ×2 IMPLANT
TUBING ENDO SMARTCAP PENTAX (MISCELLANEOUS) ×1 IMPLANT
TUBING FILTER THERMOFLATOR (ELECTROSURGICAL) ×3 IMPLANT

## 2013-01-18 NOTE — Plan of Care (Signed)
Problem: Phase I Progression Outcomes Goal: CPAP/BI-PAP utilized with sleeping per order Outcome: Not Met (add Reason) Patient left mask at home, perfers to use his own,  Pt left on oxygen overnight for support, continuous oxygen meter in place. 0xygen level 95%

## 2013-01-18 NOTE — Anesthesia Preprocedure Evaluation (Signed)
Anesthesia Evaluation  Patient identified by MRN, date of birth, ID band Patient awake    Reviewed: Allergy & Precautions, H&P , NPO status , Patient's Chart, lab work & pertinent test results  Airway Mallampati: II TM Distance: >3 FB Neck ROM: Full    Dental  (+) Dental Advisory Given   Pulmonary sleep apnea ,  breath sounds clear to auscultation        Cardiovascular hypertension, Pt. on medications Rhythm:Regular Rate:Normal     Neuro/Psych PSYCHIATRIC DISORDERS Depression negative neurological ROS     GI/Hepatic Neg liver ROS, GERD-  Medicated,  Endo/Other  Morbid obesity  Renal/GU negative Renal ROS     Musculoskeletal negative musculoskeletal ROS (+)   Abdominal   Peds  Hematology negative hematology ROS (+)   Anesthesia Other Findings   Reproductive/Obstetrics                           Anesthesia Physical Anesthesia Plan  ASA: III  Anesthesia Plan: General   Post-op Pain Management:    Induction: Intravenous  Airway Management Planned: Oral ETT  Additional Equipment:   Intra-op Plan:   Post-operative Plan: Extubation in OR  Informed Consent: I have reviewed the patients History and Physical, chart, labs and discussed the procedure including the risks, benefits and alternatives for the proposed anesthesia with the patient or authorized representative who has indicated his/her understanding and acceptance.   Dental advisory given  Plan Discussed with: CRNA  Anesthesia Plan Comments:         Anesthesia Quick Evaluation

## 2013-01-18 NOTE — Op Note (Signed)
Nathan Moses [id number] 12/28/1965 01/18/2013  Preoperative diagnosis: morbid obesity  Postoperative diagnosis: Same   Procedure: upper endoscopy  Surgeon: Leighton Ruff. Atwood Adcock M.D., FACS   Anesthesia: Gen.   Indications for procedure: 48year old WM undergoing Laparoscopic Gastric Sleeve Resection and an EGD was requested to evaluate the new gastric sleeve.   Description of procedure: After we have completed the sleeve resection, I scrubbed out and obtained the Olympus endoscope. I gently placed endoscope in the patient's oropharynx and gently glided it down the esophagus without any difficulty under direct visualization. Once I was in the gastric sleeve, I insufflated the stomach with air. I was able to cannulate and advanced the scope through the gastric sleeve. I was able to cannulate the duodenum with ease. Dr. Hassell Done had placed saline in the upper abdomen. Upon further insufflation of the gastric sleeve there was no evidence of bubbles. Upon further inspection of the gastric sleeve, the mucosa appeared normal except for some mild irritation from the [place]. There was no corkscrewing. There is no evidence of any mucosal abnormality. There was no evidence of bleeding. The gastric sleeve was decompressed. After Dr Hassell Done tied the crural suture for the hiatal hernia repair, I scrubbed back out and re-advanced the scope into the sleeve. There was no difficulty in passing the scope thru the GE junction. The scope was withdrawn. The patient tolerated this portion of the procedure well. Please see Dr Carlye Grippe operative note for details regarding the laparoscopic gastric sleeve resection.   Leighton Ruff. Redmond Pulling, MD, FACS  General, Bariatric, & Minimally Invasive Surgery  [facility], [place]

## 2013-01-18 NOTE — Progress Notes (Signed)
Patient's wife can go home and get CPAP when needed. Pt forgot to bring this am

## 2013-01-18 NOTE — Transfer of Care (Signed)
Immediate Anesthesia Transfer of Care Note  Patient: Nathan Moses  Procedure(s) Performed: Procedure(s): LAPAROSCOPIC GASTRIC SLEEVE RESECTION (N/A) LAPAROSCOPIC REPAIR OF HIATAL HERNIA  Patient Location: PACU  Anesthesia Type:General  Level of Consciousness: awake, sedated and patient cooperative  Airway & Oxygen Therapy: Patient Spontanous Breathing and Patient connected to face mask oxygen  Post-op Assessment: Report given to PACU RN and Post -op Vital signs reviewed and stable  Post vital signs: Reviewed and stable  Complications: No apparent anesthesia complications

## 2013-01-18 NOTE — Op Note (Signed)
Surgeon: Kaylyn Lim, MD, FACS  Asst:  Greer Pickerel, MD FACS  Anes:  general  Procedure: Laparoscopic sleeve gastrectomy over 29 Visigi, endoscopy, one suture posterior repair of hiatal hernia  Diagnosis: Morbid obesity  Complications: none  EBL:   minimal cc  Description of Procedure:  The patient was taken to or 1 and given general anesthesia. The abdomen was prepped with PCMX and draped sterilely. After a timeout was performed access to the abdomen was achieved to the left upper quadrant with a 5 mm Optiview technique without difficulty. Following insufflation 5 more holes were made to allow my assistant to have 25 mm sites in the left upper quadrant, a 5 mm port to the left of the umbilicus which essentially upgraded to a 12 4 videoscope, and on the right side the initial 12 mm was upgraded to a 15 for extraction of 5 laterally plus the Nathanson retractor which retracted the left lateral segment.  Initial dissection of the foregut revealed a small hiatal hernia although no obvious dimple was seen we could see where the slider occurred. I went ahead and did a posterior dissection initially and expose right and left crus and placed a suture with the Endo Stitch and tied it and left the stitch inside of for subsequent securing.  Next we measured 5 cm from the pylorus and took down the greater curvature with harmonic scalpel going all the way up to the left crus. Once this was completed the past the visit G-tube into the antrum and stuck down on it to secure it. With that in place I then fired sequentially a green load, a gold load, and blue loads with the echelon stapler creating the sleeve. A nice symmetric sleeve was created and we then blew up the visit G-tube and saw no evidence of bubbles. Dr. Greer Pickerel performed upper endoscopy showing sleeve to be nice and symmetrical. No bleeding was seen on the inside. Again no bubbles were noted. We then extracted the stomach and wall with his  assistance a 1 ahead and secured that posterior closure with a timeout. He then went up and scope the scope back into the stomach again make sure it was not too tight and we felt that we had adequately secured the hiatus.  The port sites were all injected with Exparel and the large 15 although oblique approximated with the Endo Close and a 0 Vicryl. 4-0 Vicryl using the skin and Dermabond on the outside layer for final skin closure. Patient are the procedure well taken recovery room in satisfactory condition.  Matt B. Hassell Done, [place], [facility], [place]

## 2013-01-18 NOTE — Interval H&P Note (Signed)
History and Physical Interval Note:  01/18/2013 10:43 AM  Nathan Moses  has presented today for surgery, with the diagnosis of morbid obesity   The various methods of treatment have been discussed with the patient and family. After consideration of risks, benefits and other options for treatment, the patient has consented to  Procedure(s): LAPAROSCOPIC GASTRIC SLEEVE RESECTION (N/A) as a surgical intervention .  The patient's history has been reviewed, patient examined, no change in status, stable for surgery.  I have reviewed the patient's chart and labs.  Questions were answered to the patient's satisfaction.     Sofia Jaquith B

## 2013-01-18 NOTE — H&P (View-Only) (Signed)
Chief Complaint:  Morbid obesity BMI of 43  History of Present Illness:  Nathan Moses is an 48 y.o. male from [place] who came in initially with his wife on whom I did a ventral hernia repair. They had seen a seminar done by Aaron Edelman and he is interested in a sleeve gastrectomy. I discussed that operation along with the others that we did hear in some detail. He would like to go ahead and 4 with a sleeve gastrectomy. Indicated the risk that she seems to know a lot about. He does have GERD and takes a PPI.  His upper GI showed a small hiatal hernia with GER.  Will look at and repair.    Past Medical History  Diagnosis Date  . Hypertension   . Hypercholesteremia   . Reflux   . Depression   . History of gout   . History of kidney stones   . Sleep apnea     uses c pap  . Constipation     Past Surgical History  Procedure Laterality Date  . Rotator cuff repair    . Breath tek h pylori N/A 11/19/2012    Procedure: BREATH TEK H PYLORI;  Surgeon: Pedro Earls, MD;  Location: [facility];  Service: General;  Laterality: N/A;  . Breath tek h pylori N/A 01/10/2013    Procedure: BREATH TEK H PYLORI;  Surgeon: Pedro Earls, MD;  Location: [facility];  Service: General;  Laterality: N/A;  . Cysto      Current Outpatient Prescriptions  Medication Sig Dispense Refill  . citalopram (CELEXA) 40 MG tablet Take 40 mg by mouth every evening.      . simvastatin (ZOCOR) 20 MG tablet Take 20 mg by mouth every evening.      . triamterene-hydrochlorothiazide (MAXZIDE-25) 37.5-25 MG per tablet Take 1 tablet by mouth every evening.       Marland Kitchen aspirin 81 MG tablet Take 81 mg by mouth daily.      Marland Kitchen omeprazole (PRILOSEC) 40 MG capsule Take 40 mg by mouth daily.        No current facility-administered medications for this visit.   Sulfa antibiotics Family History  Problem Relation Age of Onset  . Cancer Maternal Grandmother     breast   Social History:   reports that he has never smoked. He does  not have any smokeless tobacco history on file. He reports that he does not drink alcohol or use illicit drugs.   REVIEW OF SYSTEMS - PERTINENT POSITIVES ONLY: Positive for previous shoulder surgery. He's never had any problem with anesthesia. Denies DVT  I gave him a note to excuse him from his gym from 01/18/13 to 02/06/13.    Physical Exam:   Blood pressure 128/82, pulse 80, temperature 98.4 F (36.9 C), temperature source Temporal, resp. rate 15, height $RemoveBe'5\' 9"'chcPoAyaD$  (1.753 m), weight 280 lb 6.4 oz (127.189 kg). Body mass index is 41.39 kg/(m^2).  Gen:  WDWN white male NAD  Neurological: Alert and oriented to person, place, and time. Motor and sensory function is grossly intact  Head: Normocephalic and atraumatic.  Eyes: Conjunctivae are normal. Pupils are equal, round, and reactive to light. No scleral icterus.  Neck: Normal range of motion. Neck supple. No tracheal deviation or thyromegaly present.  Cardiovascular:  SR without murmurs or gallops.  No carotid bruits Respiratory: Effort normal.  No respiratory distress. No chest wall tenderness. Breath sounds normal.  No wheezes, rales or rhonchi.  Abdomen:  Obese caring his  weight and his abdomen although he does have broad shoulders GU: Musculoskeletal: Normal range of motion. Extremities are nontender. No cyanosis, edema or clubbing noted Lymphadenopathy: No cervical, preauricular, postauricular or axillary adenopathy is present Skin: Skin is warm and dry. No rash noted. No diaphoresis. No erythema. No pallor. Pscyh: Normal mood and affect. Behavior is normal. Judgment and thought content normal.   LABORATORY RESULTS: Results for orders placed during the hospital encounter of 01/14/13 (from the past 48 hour(s))  CBC WITH DIFFERENTIAL     Status: None   Collection Time    01/14/13  8:30 AM      Result Value Range   WBC 7.2  4.0 - 10.5 K/uL   RBC 5.65  4.22 - 5.81 MIL/uL   Hemoglobin 16.7  13.0 - 17.0 g/dL   HCT 47.1  39.0 - 52.0 %   MCV  83.4  78.0 - 100.0 fL   MCH 29.6  26.0 - 34.0 pg   MCHC 35.5  30.0 - 36.0 g/dL   RDW 13.5  11.5 - 15.5 %   Platelets 266  150 - 400 K/uL   Neutrophils Relative % 57  43 - 77 %   Neutro Abs 4.1  1.7 - 7.7 K/uL   Lymphocytes Relative 28  12 - 46 %   Lymphs Abs 2.0  0.7 - 4.0 K/uL   Monocytes Relative 10  3 - 12 %   Monocytes Absolute 0.7  0.1 - 1.0 K/uL   Eosinophils Relative 4  0 - 5 %   Eosinophils Absolute 0.3  0.0 - 0.7 K/uL   Basophils Relative 1  0 - 1 %   Basophils Absolute 0.1  0.0 - 0.1 K/uL  COMPREHENSIVE METABOLIC PANEL     Status: Abnormal   Collection Time    01/14/13  8:30 AM      Result Value Range   Sodium 137  137 - 147 mEq/L   Potassium 3.8  3.7 - 5.3 mEq/L   Chloride 95 (*) 96 - 112 mEq/L   CO2 29  19 - 32 mEq/L   Glucose, Bld 110 (*) 70 - 99 mg/dL   BUN 23  6 - 23 mg/dL   Creatinine, Ser 1.12  0.50 - 1.35 mg/dL   Calcium 9.9  8.4 - 10.5 mg/dL   Total Protein 7.5  6.0 - 8.3 g/dL   Albumin 4.3  3.5 - 5.2 g/dL   AST 30  0 - 37 U/L   ALT 37  0 - 53 U/L   Alkaline Phosphatase 64  39 - 117 U/L   Total Bilirubin 0.5  0.3 - 1.2 mg/dL   GFR calc non Af Amer 76 (*) >90 mL/min   GFR calc Af Amer 88 (*) >90 mL/min   Comment: (NOTE)     The eGFR has been calculated using the CKD EPI equation.     This calculation has not been validated in all clinical situations.     eGFR's persistently <90 mL/min signify possible Chronic Kidney     Disease.    RADIOLOGY RESULTS: No results found.  Problem List: Patient Active Problem List   Diagnosis Date Noted  . Morbid obesity 11/05/2012    Assessment & Plan: Laparoscopic sleeve gastrectomy and repair of hiatus hernia    Matt B. Hassell Done, MD, [facility] [phone] beeper [phone]  01/14/2013 11:48 AM

## 2013-01-18 NOTE — Anesthesia Postprocedure Evaluation (Signed)
Anesthesia Post Note  Patient: Nathan Moses  Procedure(s) Performed: Procedure(s) (LRB): LAPAROSCOPIC GASTRIC SLEEVE RESECTION (N/A) LAPAROSCOPIC REPAIR OF HIATAL HERNIA  Anesthesia type: General  Patient location: PACU  Post pain: Pain level controlled  Post assessment: Post-op Vital signs reviewed  Last Vitals: BP 153/90  Pulse 82  Temp(Src) 36.7 C (Oral)  Resp 12  Ht 5\' 9"  (1.753 m)  Wt 276 lb 4 oz (125.306 kg)  BMI 40.78 kg/m2  SpO2 98%  Post vital signs: Reviewed  Level of consciousness: sedated  Complications: No apparent anesthesia complications

## 2013-01-19 ENCOUNTER — Inpatient Hospital Stay (HOSPITAL_COMMUNITY): Payer: Managed Care, Other (non HMO)

## 2013-01-19 ENCOUNTER — Encounter (HOSPITAL_COMMUNITY): Payer: Self-pay | Admitting: Surgery

## 2013-01-19 LAB — CBC WITH DIFFERENTIAL/PLATELET
BASOS ABS: 0 10*3/uL (ref 0.0–0.1)
BASOS PCT: 0 % (ref 0–1)
EOS ABS: 0 10*3/uL (ref 0.0–0.7)
Eosinophils Relative: 0 % (ref 0–5)
HCT: 40.9 % (ref 39.0–52.0)
Hemoglobin: 14.1 g/dL (ref 13.0–17.0)
Lymphocytes Relative: 7 % — ABNORMAL LOW (ref 12–46)
Lymphs Abs: 0.9 10*3/uL (ref 0.7–4.0)
MCH: 28.9 pg (ref 26.0–34.0)
MCHC: 34.5 g/dL (ref 30.0–36.0)
MCV: 83.8 fL (ref 78.0–100.0)
Monocytes Absolute: 0.9 10*3/uL (ref 0.1–1.0)
Monocytes Relative: 7 % (ref 3–12)
NEUTROS PCT: 86 % — AB (ref 43–77)
Neutro Abs: 10.8 10*3/uL — ABNORMAL HIGH (ref 1.7–7.7)
Platelets: 241 10*3/uL (ref 150–400)
RBC: 4.88 MIL/uL (ref 4.22–5.81)
RDW: 13.2 % (ref 11.5–15.5)
WBC: 12.5 10*3/uL — ABNORMAL HIGH (ref 4.0–10.5)

## 2013-01-19 LAB — COMPREHENSIVE METABOLIC PANEL
ALT: 46 U/L (ref 0–53)
AST: 44 U/L — ABNORMAL HIGH (ref 0–37)
Albumin: 3.6 g/dL (ref 3.5–5.2)
Alkaline Phosphatase: 53 U/L (ref 39–117)
BUN: 10 mg/dL (ref 6–23)
CALCIUM: 8.5 mg/dL (ref 8.4–10.5)
CO2: 25 mEq/L (ref 19–32)
CREATININE: 0.98 mg/dL (ref 0.50–1.35)
Chloride: 98 mEq/L (ref 96–112)
GFR calc non Af Amer: >90 mL/min (ref >90)
Glucose, Bld: 190 mg/dL — ABNORMAL HIGH (ref 70–99)
Potassium: 4 mEq/L (ref 3.7–5.3)
Sodium: 137 mEq/L (ref 137–147)
TOTAL PROTEIN: 6.4 g/dL (ref 6.0–8.3)
Total Bilirubin: 0.5 mg/dL (ref 0.3–1.2)

## 2013-01-19 MED ORDER — IOHEXOL 300 MG/ML  SOLN
50.0000 mL | Freq: Once | INTRAMUSCULAR | Status: AC | PRN
  Administered 2013-01-19: 50 mL via ORAL

## 2013-01-19 NOTE — Progress Notes (Signed)
Patient ID: Nathan Moses, male   DOB: May 28, 1965, 48 y.o.   MRN: [id number] [facility] Progress Note:   1 Day Post-Op  Subjective: Mental status is clear Objective: Vital signs in last 24 hours: Temp:  [97.4 F (36.3 C)-98.9 F (37.2 C)] 98.4 F (36.9 C) (01/14 0617) Pulse Rate:  [73-103] 73 (01/14 0617) Resp:  [8-18] 18 (01/14 0617) BP: (109-161)/(68-92) 109/68 mmHg (01/14 0617) SpO2:  [95 %-100 %] 95 % (01/14 0617) Weight:  [276 lb 4 oz (125.306 kg)] 276 lb 4 oz (125.306 kg) (01/13 0856)  Intake/Output from previous day: 01/13 0701 - 01/14 0700 In: 4428.3 [I.V.:4428.3] Out: 1100 [Urine:1100] Intake/Output this shift:    Physical Exam: Work of breathing is normal.  Sore esp at site of 15 trocar closure  Lab Results:  Results for orders placed during the hospital encounter of 01/18/13 (from the past 48 hour(s))  CBC     Status: Abnormal   Collection Time    01/18/13  8:11 PM      Result Value Range   WBC 12.9 (*) 4.0 - 10.5 K/uL   RBC 5.36  4.22 - 5.81 MIL/uL   Hemoglobin 15.8  13.0 - 17.0 g/dL   HCT 44.6  39.0 - 52.0 %   MCV 83.2  78.0 - 100.0 fL   MCH 29.5  26.0 - 34.0 pg   MCHC 35.4  30.0 - 36.0 g/dL   RDW 13.2  11.5 - 15.5 %   Platelets 239  150 - 400 K/uL  CREATININE, SERUM     Status: Abnormal   Collection Time    01/18/13  8:11 PM      Result Value Range   Creatinine, Ser 1.06  0.50 - 1.35 mg/dL   GFR calc non Af Amer 81 (*) >90 mL/min   GFR calc Af Amer >90  >90 mL/min   Comment: (NOTE)     The eGFR has been calculated using the CKD EPI equation.     This calculation has not been validated in all clinical situations.     eGFR's persistently <90 mL/min signify possible Chronic Kidney     Disease.  CBC WITH DIFFERENTIAL     Status: Abnormal   Collection Time    01/19/13  5:45 AM      Result Value Range   WBC 12.5 (*) 4.0 - 10.5 K/uL   RBC 4.88  4.22 - 5.81 MIL/uL   Hemoglobin 14.1  13.0 - 17.0 g/dL   HCT 40.9  39.0 - 52.0 %   MCV 83.8   78.0 - 100.0 fL   MCH 28.9  26.0 - 34.0 pg   MCHC 34.5  30.0 - 36.0 g/dL   RDW 13.2  11.5 - 15.5 %   Platelets 241  150 - 400 K/uL   Neutrophils Relative % 86 (*) 43 - 77 %   Neutro Abs 10.8 (*) 1.7 - 7.7 K/uL   Lymphocytes Relative 7 (*) 12 - 46 %   Lymphs Abs 0.9  0.7 - 4.0 K/uL   Monocytes Relative 7  3 - 12 %   Monocytes Absolute 0.9  0.1 - 1.0 K/uL   Eosinophils Relative 0  0 - 5 %   Eosinophils Absolute 0.0  0.0 - 0.7 K/uL   Basophils Relative 0  0 - 1 %   Basophils Absolute 0.0  0.0 - 0.1 K/uL  COMPREHENSIVE METABOLIC PANEL     Status: Abnormal   Collection Time    01/19/13  5:45 AM      Result Value Range   Sodium 137  137 - 147 mEq/L   Potassium 4.0  3.7 - 5.3 mEq/L   Chloride 98  96 - 112 mEq/L   CO2 25  19 - 32 mEq/L   Glucose, Bld 190 (*) 70 - 99 mg/dL   BUN 10  6 - 23 mg/dL   Creatinine, Ser 0.98  0.50 - 1.35 mg/dL   Calcium 8.5  8.4 - 10.5 mg/dL   Total Protein 6.4  6.0 - 8.3 g/dL   Albumin 3.6  3.5 - 5.2 g/dL   AST 44 (*) 0 - 37 U/L   ALT 46  0 - 53 U/L   Alkaline Phosphatase 53  39 - 117 U/L   Total Bilirubin 0.5  0.3 - 1.2 mg/dL   GFR calc non Af Amer >90  >90 mL/min   GFR calc Af Amer >90  >90 mL/min   Comment: (NOTE)     The eGFR has been calculated using the CKD EPI equation.     This calculation has not been validated in all clinical situations.     eGFR's persistently <90 mL/min signify possible Chronic Kidney     Disease.    Radiology/Results: No results found.  Anti-infectives: Anti-infectives   Start     Dose/Rate Route Frequency Ordered Stop   01/18/13 0918  cefOXitin (MEFOXIN) 2 g in dextrose 5 % 50 mL IVPB     2 g 100 mL/hr over 30 Minutes Intravenous On call to O.R. 01/18/13 0918 01/18/13 1140      Assessment/Plan: Problem List: Patient Active Problem List   Diagnosis Date Noted  .  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 GERD (gastroesophageal reflux disease)  01/14/2013  . Morbid obesity 11/05/2012    Awaiting UGI and if ok start PD 1 diet.  1 Day Post-Op    LOS: 1 day   Matt B. Hassell Done, MD, [facility] [phone] beeper [phone]  01/19/2013 7:25 AM

## 2013-01-19 NOTE — Progress Notes (Signed)
Pt placed on Auto CPAP 5-15 CMH20 via nasal pillows from home.  Pt tolerating well at this time, RT to monitor and assess as needed.

## 2013-01-19 NOTE — Progress Notes (Signed)
Result of UGI done this a.m. Relayed to Dr. Hassell Done via telephone. T.O.V. to start pod #1 diet.

## 2013-01-19 NOTE — Care Management Note (Signed)
    Page 1 of 1   01/19/2013     10:29:13 AM   CARE MANAGEMENT NOTE 01/19/2013  Patient:  Nathan Moses, Nathan Moses   Account Number:  [id number]  Date Initiated:  01/19/2013  Documentation initiated by:  Sunday Spillers  Subjective/Objective Assessment:   48 yo male admitted s/p gastric sleeve. PTA lived at home with spouse.     Action/Plan:   Home when stable   Anticipated DC Date:  01/19/2013   Anticipated DC Plan:  [place]  CM consult      Choice offered to / List presented to:             Status of service:  Completed, signed off Medicare Important Message given?   (If response is "NO", the following Medicare IM given date fields will be blank) Date Medicare IM given:   Date Additional Medicare IM given:    Discharge Disposition:  HOME/SELF CARE  Per UR Regulation:  Reviewed for med. necessity/level of care/duration of stay  If discussed at [place] of Stay Meetings, dates discussed:    Comments:

## 2013-01-20 LAB — CBC WITH DIFFERENTIAL/PLATELET
BASOS ABS: 0 10*3/uL (ref 0.0–0.1)
BASOS PCT: 0 % (ref 0–1)
EOS ABS: 0.2 10*3/uL (ref 0.0–0.7)
EOS PCT: 2 % (ref 0–5)
HCT: 40.5 % (ref 39.0–52.0)
Hemoglobin: 13.6 g/dL (ref 13.0–17.0)
Lymphocytes Relative: 22 % (ref 12–46)
Lymphs Abs: 2.4 10*3/uL (ref 0.7–4.0)
MCH: 29 pg (ref 26.0–34.0)
MCHC: 33.6 g/dL (ref 30.0–36.0)
MCV: 86.4 fL (ref 78.0–100.0)
Monocytes Absolute: 0.8 10*3/uL (ref 0.1–1.0)
Monocytes Relative: 8 % (ref 3–12)
NEUTROS PCT: 68 % (ref 43–77)
Neutro Abs: 7.3 10*3/uL (ref 1.7–7.7)
PLATELETS: 234 10*3/uL (ref 150–400)
RBC: 4.69 MIL/uL (ref 4.22–5.81)
RDW: 13.8 % (ref 11.5–15.5)
WBC: 10.8 10*3/uL — ABNORMAL HIGH (ref 4.0–10.5)

## 2013-01-20 MED ORDER — HYDROCODONE-ACETAMINOPHEN 7.5-325 MG/15ML PO SOLN: 15.0000 mL | Freq: Four times a day (QID) | ORAL | Status: DC | PRN

## 2013-01-20 NOTE — Progress Notes (Signed)
Patient alert and oriented, pain is controlled. Patient is tolerating fluids, plan to advance to protein shake today.  Reviewed Gastric sleeve discharge instructions with patient and patient is able to articulate understanding.  Provided information on BELT program, Support Group and [facility] outpatient pharmacy. All questions answered, will continue to monitor.  

## 2013-01-20 NOTE — Discharge Summary (Signed)
Physician Discharge Summary  Patient ID: Nathan Moses MRN: [id number] DOB/AGE: September 21, 1965 48 y.o.  Admit date: 01/18/2013 Discharge date: 01/20/2013  Admission Diagnoses:  Morbid obesity  Discharge Diagnoses:  same  Active Problems:    laparoscopic sleeve gastrectomy Jan 2015   S/P laparoscopic sleeve gastrectomy   Surgery:  Laparoscopic sleeve gastrectomy  Discharged Condition: improved  Hospital Course:   Had surgery.  UGI on PD 1 ok.  Ready for discharge on PD 2  Consults: none  Significant Diagnostic Studies: UGI    Discharge Exam: Blood pressure 117/76, pulse 51, temperature 97.6 F (36.4 C), temperature source Oral, resp. rate 16, height 5\' 9"  (1.753 m), weight 280 lb (127.007 kg), SpO2 95.00%. Incisions ok.  Sore as anticipated.   Disposition: 01-Home or Self Care  Discharge Orders   Future Appointments Provider Department Dept Phone   02/03/2013 9:10 AM Pedro Earls, MD [facility], [place] [phone]   Future Orders Complete By Expires   Increase activity slowly  As directed    No wound care  As directed        Medication List         aspirin 81 MG tablet  Take 81 mg by mouth daily.     citalopram 40 MG tablet  Commonly known as:  CELEXA  Take 40 mg by mouth every evening.     HYDROcodone-acetaminophen 7.5-325 mg/15 ml solution  Commonly known as:  HYCET  Take 15 mLs by mouth 4 (four) times daily as needed for moderate pain.     omeprazole 40 MG capsule  Commonly known as:  PRILOSEC  Take 40 mg by mouth daily.     simvastatin 20 MG tablet  Commonly known as:  ZOCOR  Take 20 mg by mouth every evening.     triamterene-hydrochlorothiazide 37.5-25 MG per tablet  Commonly known as:  MAXZIDE-25  Take 1 tablet by mouth every evening.           Follow-up Information   Follow up with Pedro Earls, MD.   Specialty:  General Surgery   Contact information:   [address] [place] [address] [phone]        Signed: Pedro Earls 01/20/2013, 8:12 AM

## 2013-01-20 NOTE — Discharge Instructions (Signed)

## 2013-01-26 ENCOUNTER — Telehealth: Payer: Self-pay | Admitting: Dietician

## 2013-01-26 NOTE — Telephone Encounter (Signed)
Sent Ronalee Belts information about Nascobal B12 and bariatric vitamins via email.

## 2013-02-01 ENCOUNTER — Encounter: Payer: Managed Care, Other (non HMO) | Attending: Surgery | Admitting: Dietician

## 2013-02-01 DIAGNOSIS — Z713 Dietary counseling and surveillance: Secondary | ICD-10-CM | POA: Insufficient documentation

## 2013-02-01 NOTE — Progress Notes (Signed)
  Follow-up visit:  2 Weeks Post-Operative Gastric sleeve Surgery  Medical Nutrition Therapy:  Appt start time: 1610 end time:  1100.  Primary concerns today: Post-operative Bariatric Surgery Nutrition Management.  Taking Flintstones Complete. Eating SF Jello, broth, Greek yogurt, protein shakes.   Surgery date: 01/18/2013 Surgery type: Gastric sleeve Start weight: 276.8 lbs (on 01/17/13 per patient; no previous weight at [facility])  Weight today: 261.5 lbs Weight lost: 15.3 lbs  TANITA  BODY COMP RESULTS  02/01/13   BMI (kg/m^2) 39.8   Fat Mass (lbs) 124   Fat Free Mass (lbs) 137.5   Total Body Water (lbs) 100.5     Preferred Learning Style:  No preference indicated   Learning Readiness:  Ready   Fluid intake: water; 96 oz Estimated total protein intake: 70-80 g  Medications: see list; no longer taking any Supplementation: taking everything but B12; faxed Nascobal form today   Using straws: no Drinking while eating: no Hair loss: no Carbonated beverages: no N/V/D/C: no Dumping syndrome: no  Recent physical activity:  Some walking; plans to go back to the gym when MD clears  Progress Towards Goal(s):  In progress.  Handouts given during visit include:  Phase III A High protein   Nutritional Diagnosis:   [place]-3.3 Overweight/obesity related to past poor dietary habits and physical inactivity as evidenced by patient w/ recent gastric sleeve surgery following dietary guidelines for continued weight loss.    Intervention:  Nutrition education provided.  Teaching Method Utilized:  Visual Auditory Hands on  Barriers to learning/adherence to lifestyle change: none  Demonstrated degree of understanding via:  Teach Back   Monitoring/Evaluation:  Dietary intake, exercise, and body weight. Follow up in 6 weeks for 2 month post-op visit.

## 2013-02-01 NOTE — Patient Instructions (Addendum)
Goals:  Follow Phase 3A: Soft High Protein Phase  Eat 3-6 small meals/snacks, every 3-5 hrs  Maintain lean protein foods to meet 80g goal  Maintain fluid intake to 64oz +  Avoid drinking 15 minutes before, during and 30 minutes after eating  Aim for >30 min of physical activity daily per MD

## 2013-02-03 ENCOUNTER — Encounter (INDEPENDENT_AMBULATORY_CARE_PROVIDER_SITE_OTHER): Payer: Self-pay | Admitting: Surgery

## 2013-02-03 ENCOUNTER — Ambulatory Visit (INDEPENDENT_AMBULATORY_CARE_PROVIDER_SITE_OTHER): Payer: Commercial Indemnity | Admitting: Surgery

## 2013-02-03 VITALS — BP 128/86 | HR 72 | Temp 98.1°F | Resp 14 | Ht 69.0 in | Wt 264.0 lb

## 2013-02-03 DIAGNOSIS — Z9884 Bariatric surgery status: Secondary | ICD-10-CM

## 2013-02-03 NOTE — Progress Notes (Signed)
Nathan Moses 48 y.o.  Body mass index is 38.97 kg/(m^2).  Patient Active Problem List   Diagnosis Date Noted  .  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 GERD (gastroesophageal reflux disease) 01/14/2013  . Morbid obesity 11/05/2012    Allergies  Allergen Reactions  . Sulfa Antibiotics Rash    Severe rash. Head to toe. Burning and itching badly    Past Surgical History  Procedure Laterality Date  . Rotator cuff repair    . Breath tek h pylori N/A 11/19/2012    Procedure: BREATH TEK H PYLORI;  Surgeon: Pedro Earls, MD;  Location: [facility];  Service: General;  Laterality: N/A;  . Breath tek h pylori N/A 01/10/2013    Procedure: BREATH TEK H PYLORI;  Surgeon: Pedro Earls, MD;  Location: [facility];  Service: General;  Laterality: N/A;  . Cysto    . Laparoscopic gastric sleeve resection N/A 01/18/2013    Procedure: LAPAROSCOPIC GASTRIC SLEEVE RESECTION;  Surgeon: Pedro Earls, MD;  Location: [facility];  Service: General;  Laterality: N/A;  . Hiatal hernia repair  01/18/2013    Procedure: LAPAROSCOPIC REPAIR OF HIATAL HERNIA;  Surgeon: Pedro Earls, MD;  Location: [facility];  Service: General;;   Kandice Hams, MD No diagnosis found.  Has lost 21 lbs since surgery.  Incisions look good.  Doing very well.  OK to resume exercise with trainer.  Matt B. Hassell Done, MD, [facility] [phone] beeper [phone]  02/03/2013 9:56 AM

## 2013-02-03 NOTE — Patient Instructions (Signed)
Thanks for your patience.  If you need further assistance after leaving the office, please call our office and speak with a [facility] nurse.  [phone].  If you want to leave a message for Dr. Jerric Oyen, please call his office phone at [phone]. 

## 2013-02-23 ENCOUNTER — Telehealth: Payer: Self-pay | Admitting: Dietician

## 2013-02-23 NOTE — Telephone Encounter (Signed)
Nathan Moses requested that I call him to discuss his episodes of lightheadedness and dizziness during workouts. I suggested that he add a small amount (15g) of carbohydrate on the days he works out. We discussed beans or half a banana added to a protein shake.

## 2013-02-24 ENCOUNTER — Telehealth: Payer: Self-pay | Admitting: Dietician

## 2013-02-24 NOTE — Telephone Encounter (Signed)
Email on 02/14/2013  Verlee Rossetti utterly confused about the carbs part of my new diet plan.  On the sheet you gave me, I'm allowed "Beans or Lentils (dried or canned)(1/2 cup)" but when I look at the labels (see examples below), the carbs are through the roof.  Are these considered "good carbs" as I've heard people say without understanding the difference?  I want to be eating according to the plan, but I need some clarification on this and, possibly, more when it comes to reading labels. Also, what about high protein bars like Pure Protein or these Kind bars (not nearly as high in protein as the Pure Protein bars) my wife likes so much?  Reply on 02/14/2013  [facility] Ronalee Belts! Great questions!  Yes, beans are very high in carbohydrates but they are the "good carbs." Notice that they are high in fiber but low in sugar. Fiber and sugar are both forms of carbohydrates but they can help determine the difference between "good carbs" and "bad carbs." Foods high in sugar will slow weight loss. Carbs high in fiber (such as veggies and beans) will help facilitate weight loss, keep you fuller longer, and keep your bowels regular. Also, beans are high in protein and other nutrients! They are also nice and soft. You'll just want to watch your portions and stick to  a cup.  Regarding the Kind bars, they are much higher in fat and lower in protein compared to the Pure Protein bars. They will not be as beneficial to your weight loss.  I know reading food labels can be really overwhelming and kind of tricky. It's almost as if the food companies are trying to make it confusing J Keep choosing foods that are high in protein and low in carbohydrates and watch portions (especially with beans). Foods that are also low in fat will facilitate weight loss. Remember when it comes to carbs, fiber is good and sugar is not so good. Veggies (except potatoes, corn, and peas) are "free" foods!!  If you have any other questions or want  me to clarify anything, please don't hesitate to shoot me another email. Thanks! Magda Paganini

## 2013-03-15 ENCOUNTER — Encounter: Payer: Managed Care, Other (non HMO) | Attending: Surgery | Admitting: Dietician

## 2013-03-15 DIAGNOSIS — Z713 Dietary counseling and surveillance: Secondary | ICD-10-CM | POA: Insufficient documentation

## 2013-03-15 NOTE — Progress Notes (Signed)
  Follow-up visit:  8 Weeks Post-Operative Gastric sleeve Surgery  Medical Nutrition Therapy:  Appt start time: 800 end time:  830  Primary concerns today: Post-operative Bariatric Surgery Nutrition Management.  Jaspreet is here today having lost 16.5 pounds (23 pounds of fat) since last visit 6 weeks ago. He reports taking Flintstones Complete multivitamins, as he doesn't like the Nascobal supplements. However, he is using the B12 spray. He is eating [place] yogurt, protein shakes, salmon and other fish, meatloaf, green beans, Quest bars and squash. Not having juice or sodas. He tried 1/2 a banana and strawberry (per my recommendation prior to long workouts) in a smoothie and reports taste aversion to the sweetness. Plans to add coffee back.  Surgery date: 01/18/2013 Surgery type: Gastric sleeve Start weight: 276.8 lbs (on 01/17/13 per patient; no previous weight at [facility])  Weight today: 245 lbs Weight lost: 16.5 lbs Total weight lost: 32 lbs Goal weight: 180 lbs  TANITA  BODY COMP RESULTS  02/01/13 03/15/2013   BMI (kg/m^2) 39.8 36.2   Fat Mass (lbs) 124 101   Fat Free Mass (lbs) 137.5 144   Total Body Water (lbs) 100.5 105.5     Preferred Learning Style:  No preference indicated   Learning Readiness:  Ready   Fluid intake: water; 64+ oz Estimated total protein intake: 60-80+ g per patient estimate  Medications: see list Supplementation: taking  Using straws: no Drinking while eating: no Hair loss: no Carbonated beverages: no N/V/D/C: constipation, needs laxatives and stool softeners; nausea/discomfort when he eats too fast Dumping syndrome: no  Recent physical activity:  Going to the gym  Progress Towards Goal(s):  In progress.  Handouts given during visit include:  Phase III B High protein + non-starchy vegetables   Nutritional Diagnosis:   [place]-3.3 Overweight/obesity related to past poor dietary habits and physical inactivity as evidenced by patient w/ recent  gastric sleeve surgery following dietary guidelines for continued weight loss.    Intervention:  Nutrition education provided.  Teaching Method Utilized:  Visual Auditory Hands on  Barriers to learning/adherence to lifestyle change: none  Demonstrated degree of understanding via:  Teach Back   Monitoring/Evaluation:  Dietary intake, exercise, and body weight. Follow up in 4 weeks for 3 month post-op visit.

## 2013-03-15 NOTE — Patient Instructions (Addendum)
-  Keep eating protein first!  Keep up the good work!!   02/01/13 03/15/2013   BMI (kg/m^2) 39.8 36.2   Fat Mass (lbs) 124 101   Fat Free Mass (lbs) 137.5 144   Total Body Water (lbs) 100.5 105.5

## 2013-03-31 ENCOUNTER — Ambulatory Visit (INDEPENDENT_AMBULATORY_CARE_PROVIDER_SITE_OTHER): Payer: Commercial Indemnity | Admitting: Surgery

## 2013-03-31 DIAGNOSIS — Z9884 Bariatric surgery status: Secondary | ICD-10-CM

## 2013-03-31 NOTE — Progress Notes (Signed)
Nathan Moses 48 y.o.  There is no weight on file to calculate BMI.  Patient Active Problem List   Diagnosis Date Noted  .  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 GERD (gastroesophageal reflux disease) 01/14/2013  . Morbid obesity 11/05/2012    Allergies  Allergen Reactions  . Sulfa Antibiotics Rash    Severe rash. Head to toe. Burning and itching badly    Past Surgical History  Procedure Laterality Date  . Rotator cuff repair    . Breath tek h pylori N/A 11/19/2012    Procedure: BREATH TEK H PYLORI;  Surgeon: Pedro Earls, MD;  Location: [facility];  Service: General;  Laterality: N/A;  . Breath tek h pylori N/A 01/10/2013    Procedure: BREATH TEK H PYLORI;  Surgeon: Pedro Earls, MD;  Location: [facility];  Service: General;  Laterality: N/A;  . Cysto    . Laparoscopic gastric sleeve resection N/A 01/18/2013    Procedure: LAPAROSCOPIC GASTRIC SLEEVE RESECTION;  Surgeon: Pedro Earls, MD;  Location: [facility];  Service: General;  Laterality: N/A;  . Hiatal hernia repair  01/18/2013    Procedure: LAPAROSCOPIC REPAIR OF HIATAL HERNIA;  Surgeon: Pedro Earls, MD;  Location: [facility];  Service: General;;   Kandice Hams, MD No diagnosis found.  Weight down to 242.  At least 40+ lbs weight loss since sleeve.  Picture given of pre and post sleeve UGI.  Discussed diet.   Return 4 months.   Matt B. Hassell Done, MD, [facility] [phone] beeper [phone]  03/31/2013 9:38 AM

## 2013-04-12 ENCOUNTER — Encounter: Payer: Managed Care, Other (non HMO) | Attending: Surgery | Admitting: Dietician

## 2013-04-12 DIAGNOSIS — Z713 Dietary counseling and surveillance: Secondary | ICD-10-CM | POA: Insufficient documentation

## 2013-04-12 NOTE — Progress Notes (Signed)
  Follow-up visit:  12 Weeks Post-Operative Gastric sleeve Surgery  Medical Nutrition Therapy:  Appt start time: 530 end time:  73  Primary concerns today: Post-operative Bariatric Surgery Nutrition Management.  Ronalee Belts reports tolerating all foods that he has tried. Craving carbs and has had some pizza and small amounts of chips and crackers.    Surgery date: 01/18/2013 Surgery type: Gastric sleeve Start weight: 276.8 lbs (on 01/17/13 per patient; no previous weight at [facility])  Weight today: 236 lbs Weight lost: 9 lbs Total weight lost: 41 lbs Goal weight: 180 lbs  TANITA  BODY COMP RESULTS  02/01/13 03/15/2013 04/12/13   BMI (kg/m^2) 39.8 36.2 34.9   Fat Mass (lbs) 124 101 90   Fat Free Mass (lbs) 137.5 144 146   Total Body Water (lbs) 100.5 105.5 107     Preferred Learning Style:  No preference indicated   Learning Readiness:  Ready   Fluid intake: water with Mio, lemon juice, or Crystal Light; 64+ oz Estimated total protein intake: 60-80+ g per patient estimate  Medications: see list Supplementation: taking  Using straws: no Drinking while eating: no Hair loss: no Carbonated beverages: no N/V/D/C: constipation resolved with coffee Dumping syndrome: no  Recent physical activity:  None in the past 2 weeks; plans to return  Progress Towards Goal(s):  In progress.    Nutritional Diagnosis:   [place]-3.3 Overweight/obesity related to past poor dietary habits and physical inactivity as evidenced by patient w/ recent gastric sleeve surgery following dietary guidelines for continued weight loss.    Intervention:  Nutrition education provided.  Teaching Method Utilized:  Visual Auditory Hands on  Barriers to learning/adherence to lifestyle change: none  Demonstrated degree of understanding via:  Teach Back   Monitoring/Evaluation:  Dietary intake, exercise, and body weight. Follow up in 3 months for 6 month post-op visit.

## 2013-04-12 NOTE — Patient Instructions (Addendum)
-  Keep eating protein first! Aim for 80g (meat, Greek yogurt, Quest bars, beans, nuts, low fat cheese, protein Lunchables)  -Stick to 15g of carbohydrates or less per meal -Take supplements more consistently   02/01/13 03/15/2013 04/12/13   BMI (kg/m^2) 39.8 36.2 34.9   Fat Mass (lbs) 124 101 90   Fat Free Mass (lbs) 137.5 144 146   Total Body Water (lbs) 100.5 105.5 107

## 2013-07-06 ENCOUNTER — Encounter: Payer: BC Managed Care – PPO | Attending: Surgery | Admitting: Dietician

## 2013-07-06 VITALS — Ht 69.0 in | Wt 209.0 lb

## 2013-07-06 DIAGNOSIS — E669 Obesity, unspecified: Secondary | ICD-10-CM

## 2013-07-06 DIAGNOSIS — Z713 Dietary counseling and surveillance: Secondary | ICD-10-CM | POA: Insufficient documentation

## 2013-07-06 NOTE — Patient Instructions (Addendum)
-  Work on getting supplements in every day -Get back into bringing lunch to work -Avoid skipping meals -Stick to 15 grams of carbs per meal (45 grams per day)  TANITA  BODY COMP RESULTS  02/01/13 03/15/2013 04/12/13 07/06/2013   BMI (kg/m^2) 39.8 36.2 34.9 30.9   Fat Mass (lbs) 124 101 90 66   Fat Free Mass (lbs) 137.5 144 146 143   Total Body Water (lbs) 100.5 105.5 107 104.5

## 2013-07-06 NOTE — Progress Notes (Signed)
  Follow-up visit:  6 months Post-Operative Gastric sleeve Surgery  Medical Nutrition Therapy:  Appt start time: 530 end time:  37  Primary concerns today: Post-operative Bariatric Surgery Nutrition Management.  Nathan Moses returns today having lost 27 pounds in the last 3 months. He started a new job in May so he is not going to the gym as often. He has a treadmill, elliptical, and Bowflex at home. He reports that he treats himself to trail mix with M&Ms or a Frappucino occasionally. Nathan Moses is meeting his protein needs but tends to replace up to 2 meals per day with Quest bars.    Surgery date: 01/18/2013 Surgery type: Gastric sleeve Start weight: 276.8 lbs (on 01/17/13 per patient; no previous weight at [facility])  Weight today: 209 lbs Weight lost: 27 lbs Total weight lost: 68 lbs  Goal weight: 180 lbs  TANITA  BODY COMP RESULTS  02/01/13 03/15/2013 04/12/13 07/06/2013   BMI (kg/m^2) 39.8 36.2 34.9 30.9   Fat Mass (lbs) 124 101 90 66   Fat Free Mass (lbs) 137.5 144 146 143   Total Body Water (lbs) 100.5 105.5 107 104.5    B: Coffee and Quest bar (21g) Snk: Peanuts or sunflower seeds L:  Quest bar or beef jerky or chicken breast with vegetable Snk: peanuts or sunflower seeds D: 6 oz filet and steamed broccoli OR chicken or shrimp or beef or meatballs   Preferred Learning Style:  No preference indicated   Learning Readiness:  Ready  Fluid intake: water with Mio or [place] drops, lemon juice, or Crystal Light; 64+ oz Estimated total protein intake: 60-80+ g per patient estimate  Medications: see list Supplementation: "not keeping up with it"  Using straws: no Drinking while eating: no Hair loss: no Carbonated beverages: no N/V/D/C: constipation resolved with coffee Dumping syndrome: no  Recent physical activity:  None in the past 2 weeks; plans to return  Progress Towards Goal(s):  In progress.    Nutritional Diagnosis:   [place]-3.3 Overweight/obesity related to past poor dietary  habits and physical inactivity as evidenced by patient w/ recent gastric sleeve surgery following dietary guidelines for continued weight loss.    Intervention:  Nutrition education provided.  Teaching Method Utilized:  Visual Auditory Hands on  Barriers to learning/adherence to lifestyle change: none  Demonstrated degree of understanding via:  Teach Back   Monitoring/Evaluation:  Dietary intake, exercise, and body weight. Follow up in 6 months for 12 month post-op visit.

## 2013-07-29 ENCOUNTER — Ambulatory Visit (INDEPENDENT_AMBULATORY_CARE_PROVIDER_SITE_OTHER): Payer: BC Managed Care – PPO | Admitting: Surgery

## 2013-07-29 ENCOUNTER — Encounter (INDEPENDENT_AMBULATORY_CARE_PROVIDER_SITE_OTHER): Payer: Self-pay | Admitting: Surgery

## 2013-07-29 VITALS — BP 124/74 | HR 73 | Temp 98.0°F | Ht 69.0 in | Wt 205.6 lb

## 2013-07-29 DIAGNOSIS — Z9884 Bariatric surgery status: Secondary | ICD-10-CM

## 2013-07-29 NOTE — Progress Notes (Signed)
Nathan Moses 48 y.o.  Body mass index is 30.35 kg/(m^2).  Patient Active Problem List   Diagnosis Date Noted  .  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 GERD (gastroesophageal reflux disease) 01/14/2013  . Morbid obesity 11/05/2012    Allergies  Allergen Reactions  . Sulfa Antibiotics Rash    Severe rash. Head to toe. Burning and itching badly      Past Surgical History  Procedure Laterality Date  . Rotator cuff repair    . Breath tek h pylori N/A 11/19/2012    Procedure: BREATH TEK H PYLORI;  Surgeon: Pedro Earls, MD;  Location: [facility];  Service: General;  Laterality: N/A;  . Breath tek h pylori N/A 01/10/2013    Procedure: BREATH TEK H PYLORI;  Surgeon: Pedro Earls, MD;  Location: [facility];  Service: General;  Laterality: N/A;  . Cysto    . Laparoscopic gastric sleeve resection N/A 01/18/2013    Procedure: LAPAROSCOPIC GASTRIC SLEEVE RESECTION;  Surgeon: Pedro Earls, MD;  Location: [facility];  Service: General;  Laterality: N/A;  . Hiatal hernia repair  01/18/2013    Procedure: LAPAROSCOPIC REPAIR OF HIATAL HERNIA;  Surgeon: Pedro Earls, MD;  Location: [facility];  Service: General;;   Kandice Hams, MD No diagnosis found.  Doing well.  Occasional reflux.  Doing very well having lost 80 lbs.  Will get Polite to drawn lab.  He is taking his vitamins sometimes-takes Nasocobal weekly Return Jan-1 year.   Matt B. Hassell Done, MD, [facility] [phone] beeper [phone]  07/29/2013 2:33 PM

## 2013-07-29 NOTE — Patient Instructions (Addendum)
Have Dr. Delfina Redwood draw your six month labs.   Followup in 6 months  Remember to take your vitamins

## 2013-12-28 ENCOUNTER — Ambulatory Visit: Payer: BC Managed Care – PPO | Admitting: Dietician

## 2014-02-03 ENCOUNTER — Ambulatory Visit: Payer: BC Managed Care – PPO | Admitting: Dietician

## 2014-02-07 ENCOUNTER — Ambulatory Visit: Payer: BC Managed Care – PPO | Admitting: Dietician

## 2014-06-18 IMAGING — CR DG CHEST 2V
2 series · 2 of 2 positions shown · non-contrast
Comparison: None.

CLINICAL DATA: Hypertension, pre bariatric surgery

EXAM:
CHEST  2 VIEW

[view not recorded (1 of 2)]
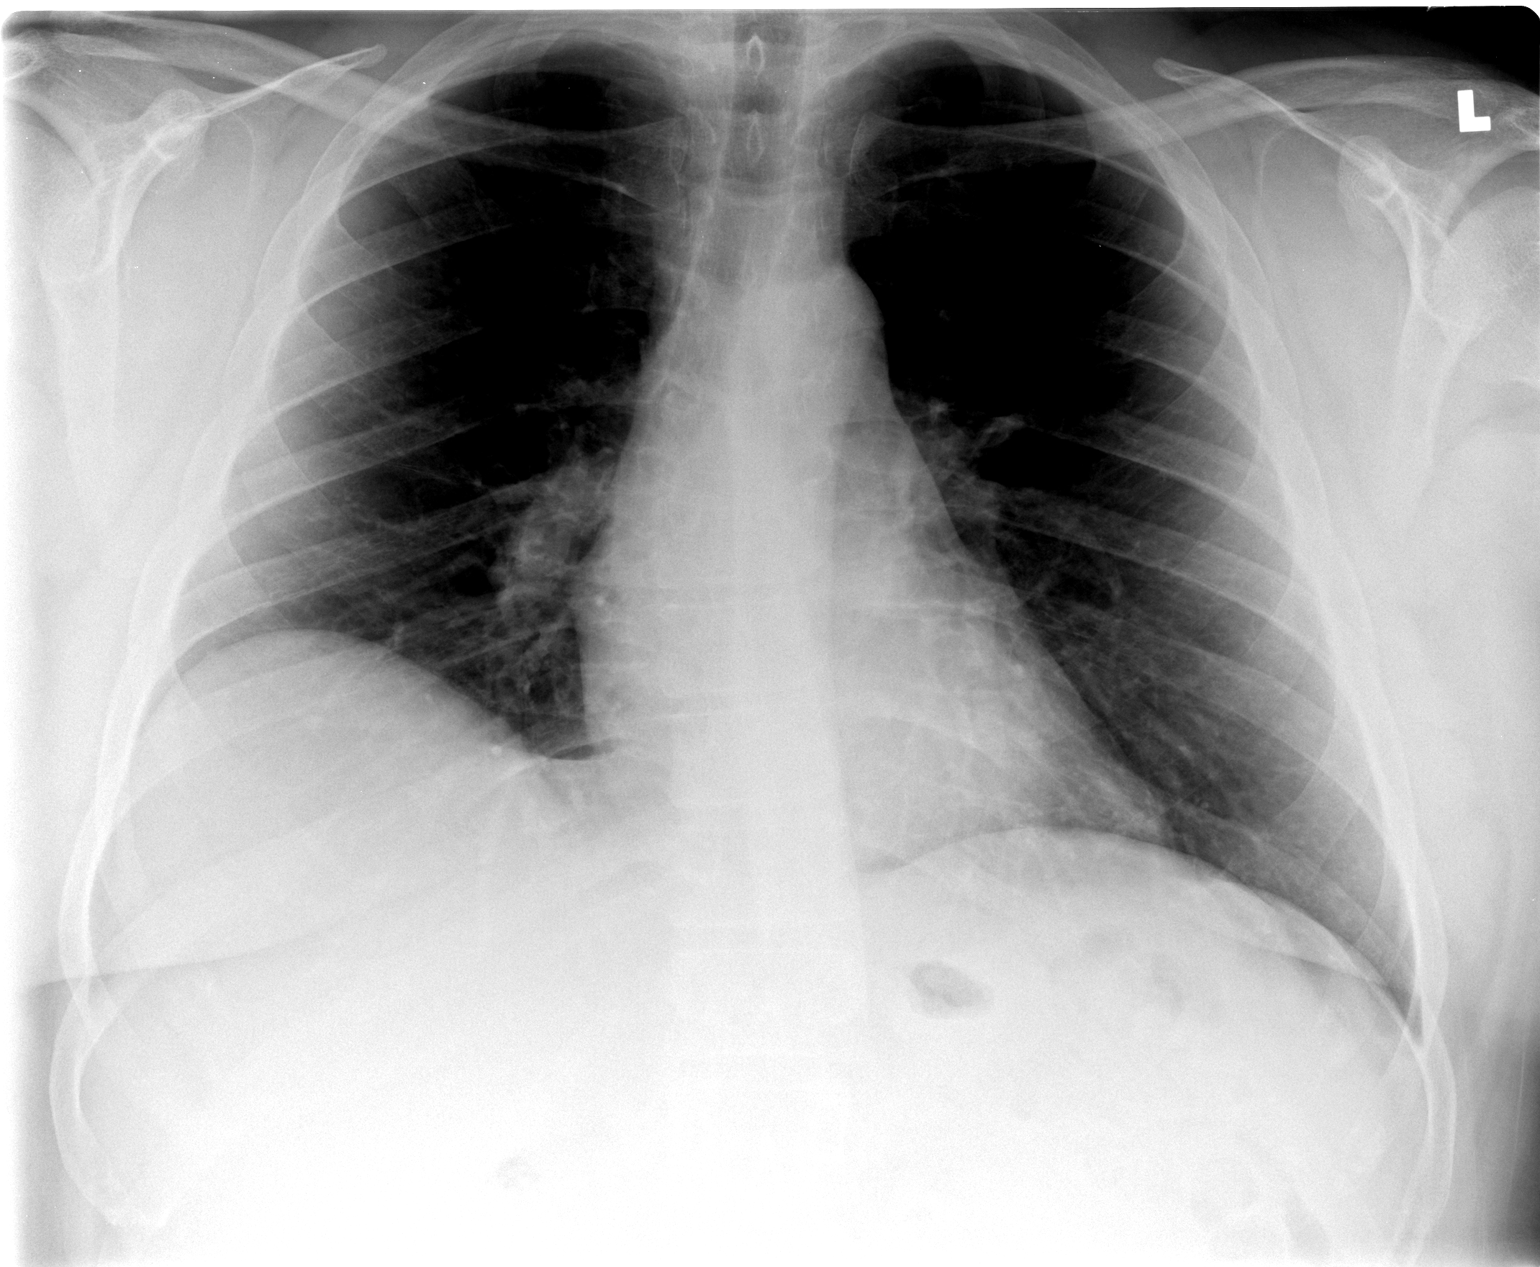

[view not recorded (2 of 2)]
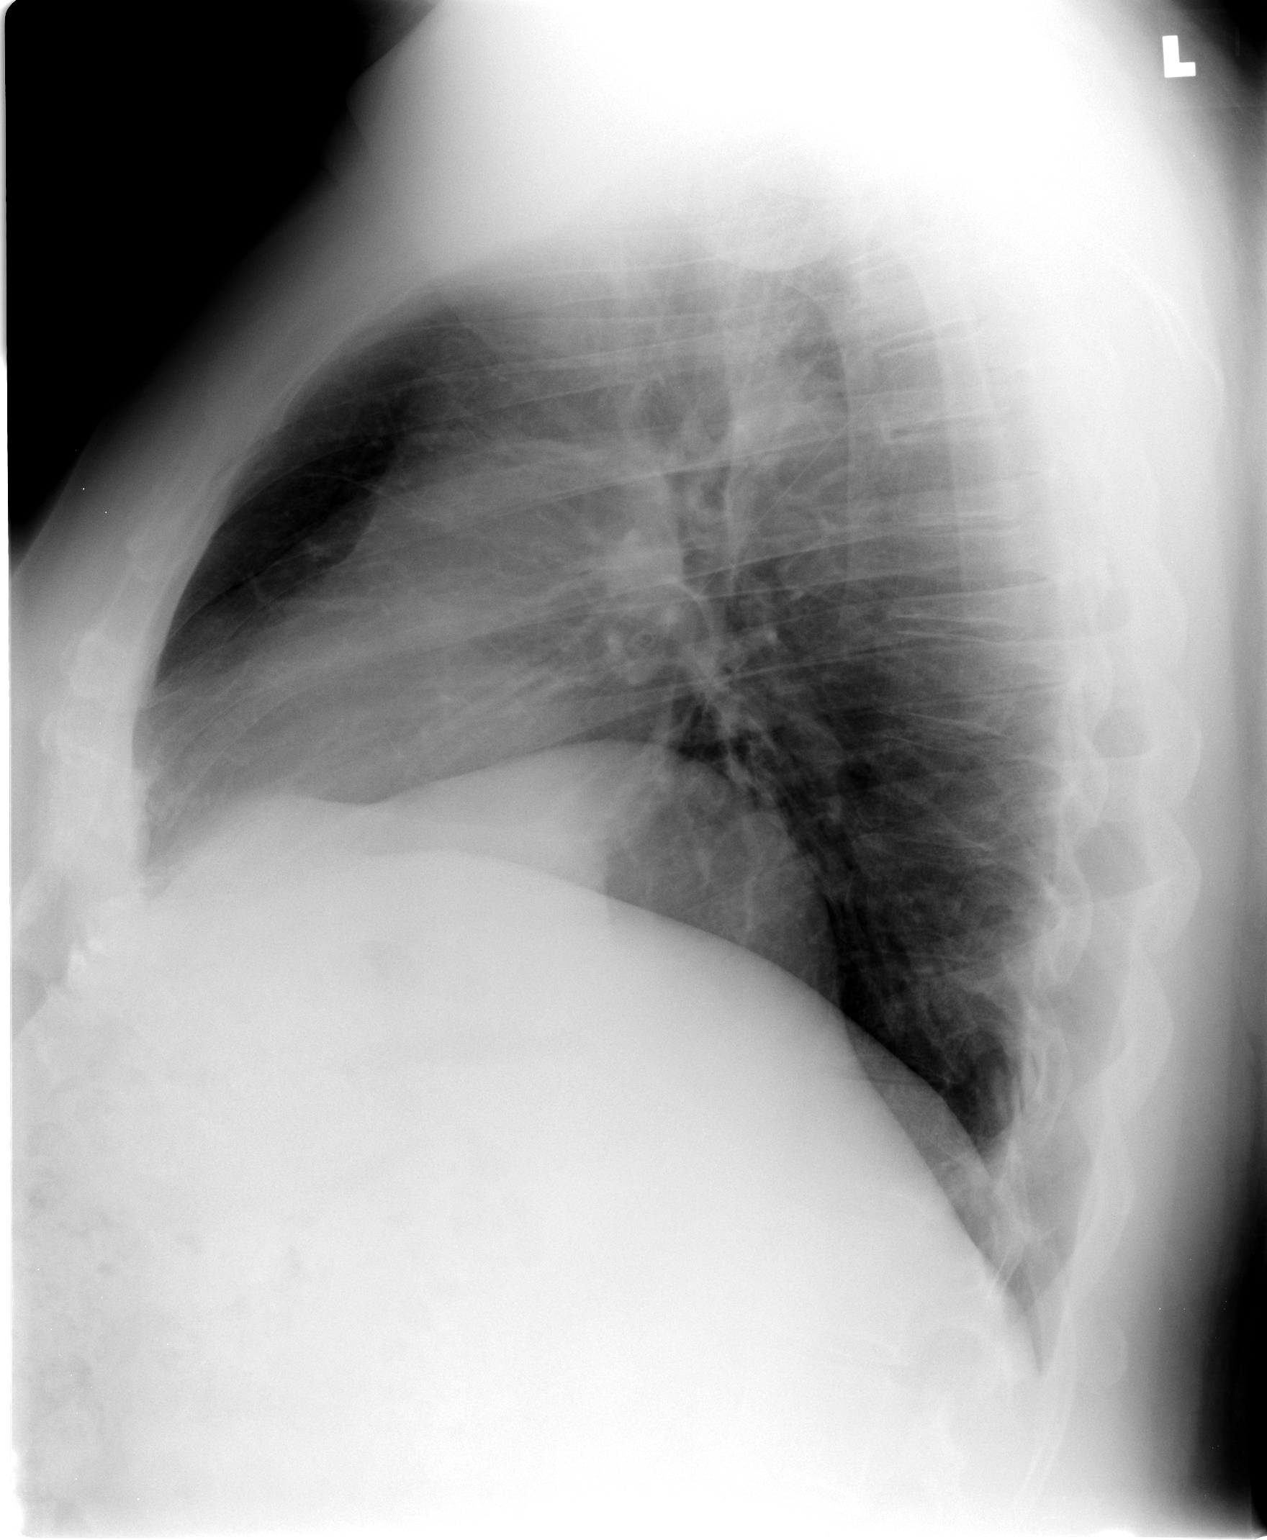

[2 of 2 positions shown; findings below may reference images not displayed]

FINDINGS: Lungs are clear. No pleural effusion or pneumothorax.

Mild elevation of the right hemidiaphragm.

The heart is normal in size.

Mild degenerative changes of the visualized thoracolumbar spine.
IMPRESSION: No evidence of acute cardiopulmonary disease.

## 2014-11-10 ENCOUNTER — Telehealth (HOSPITAL_COMMUNITY): Payer: Self-pay

## 2014-11-10 NOTE — Telephone Encounter (Signed)
This patient is overdue for recommended follow-up with a bariatric surgeon at [facility]. A mailing was sent to the patient on 09/01/14 along with a patient survey from both [place] with no response yet at this time.  Call attempted today to reestablish post-op care with [facility], but unable to reach patient by phone. A voicemail message was left requesting a call back & advising the patient on the benefits of follow-up care, and directing them to call [facility] at [phone] to schedule an appointment at their earliest convenience.   Alphonsa Overall. [facility] Bariatric Office Coordinator [phone]

## 2015-12-13 ENCOUNTER — Other Ambulatory Visit: Payer: Self-pay | Admitting: Gastroenterology

## 2016-02-11 ENCOUNTER — Ambulatory Visit (HOSPITAL_COMMUNITY): Payer: Commercial Managed Care - HMO | Admitting: Certified Registered Nurse Anesthetist

## 2016-02-11 ENCOUNTER — Encounter (HOSPITAL_COMMUNITY)
Admission: RE | Disposition: A | Discharge status: Home / Self Care | Payer: Self-pay | Source: Ambulatory Visit | Attending: Gastroenterology

## 2016-02-11 ENCOUNTER — Encounter (HOSPITAL_COMMUNITY): Payer: Self-pay | Admitting: *Deleted

## 2016-02-11 ENCOUNTER — Ambulatory Visit (HOSPITAL_COMMUNITY)
Admission: RE | Admit: 2016-02-11 | Discharge: 2016-02-11 | Disposition: A | Discharge status: Home / Self Care | Payer: Commercial Managed Care - HMO | Source: Ambulatory Visit | Attending: Gastroenterology | Admitting: Gastroenterology

## 2016-02-11 DIAGNOSIS — K219 Gastro-esophageal reflux disease without esophagitis: Secondary | ICD-10-CM | POA: Diagnosis not present

## 2016-02-11 DIAGNOSIS — D123 Benign neoplasm of transverse colon: Secondary | ICD-10-CM | POA: Insufficient documentation

## 2016-02-11 DIAGNOSIS — Z1211 Encounter for screening for malignant neoplasm of colon: Secondary | ICD-10-CM | POA: Diagnosis not present

## 2016-02-11 DIAGNOSIS — G4733 Obstructive sleep apnea (adult) (pediatric): Secondary | ICD-10-CM | POA: Insufficient documentation

## 2016-02-11 DIAGNOSIS — F329 Major depressive disorder, single episode, unspecified: Secondary | ICD-10-CM | POA: Insufficient documentation

## 2016-02-11 DIAGNOSIS — K635 Polyp of colon: Secondary | ICD-10-CM | POA: Diagnosis not present

## 2016-02-11 DIAGNOSIS — M109 Gout, unspecified: Secondary | ICD-10-CM | POA: Diagnosis not present

## 2016-02-11 DIAGNOSIS — E7800 Pure hypercholesterolemia, unspecified: Secondary | ICD-10-CM | POA: Insufficient documentation

## 2016-02-11 DIAGNOSIS — I10 Essential (primary) hypertension: Secondary | ICD-10-CM | POA: Insufficient documentation

## 2016-02-11 DIAGNOSIS — E669 Obesity, unspecified: Secondary | ICD-10-CM | POA: Insufficient documentation

## 2016-02-11 HISTORY — PX: COLONOSCOPY WITH PROPOFOL: SHX5780

## 2016-02-11 SURGERY — COLONOSCOPY WITH PROPOFOL
Anesthesia: Monitor Anesthesia Care

## 2016-02-11 MED ORDER — PROPOFOL 10 MG/ML IV BOLUS
INTRAVENOUS | Status: AC
  Filled 2016-02-11: qty 20

## 2016-02-11 MED ORDER — SODIUM CHLORIDE 0.9 % IV SOLN: INTRAVENOUS | Status: DC

## 2016-02-11 MED ORDER — PROPOFOL 10 MG/ML IV BOLUS
INTRAVENOUS | Status: DC | PRN
  Administered 2016-02-11: 20 mg via INTRAVENOUS
  Administered 2016-02-11: 60 mg via INTRAVENOUS
  Administered 2016-02-11: 20 mg via INTRAVENOUS

## 2016-02-11 MED ORDER — PROPOFOL 10 MG/ML IV BOLUS
INTRAVENOUS | Status: AC
Start: 2016-02-11 — End: 2016-02-11
  Filled 2016-02-11: qty 20

## 2016-02-11 MED ORDER — LACTATED RINGERS IV SOLN: INTRAVENOUS | Status: DC

## 2016-02-11 MED ORDER — PROPOFOL 500 MG/50ML IV EMUL
INTRAVENOUS | Status: DC | PRN
  Administered 2016-02-11: 200 ug/kg/min via INTRAVENOUS

## 2016-02-11 MED ORDER — LACTATED RINGERS IV SOLN
INTRAVENOUS | Status: DC
  Administered 2016-02-11: 13:00:00 via INTRAVENOUS

## 2016-02-11 SURGICAL SUPPLY — 22 items

## 2016-02-11 NOTE — Anesthesia Preprocedure Evaluation (Signed)
Anesthesia Evaluation  Patient identified by MRN, date of birth, ID band Patient awake    Reviewed: Allergy & Precautions, H&P , NPO status , Patient's Chart, lab work & pertinent test results  Airway Mallampati: II   Neck ROM: full    Dental   Pulmonary sleep apnea ,    breath sounds clear to auscultation       Cardiovascular hypertension,  Rhythm:regular Rate:Normal     Neuro/Psych PSYCHIATRIC DISORDERS Depression    GI/Hepatic GERD  ,  Endo/Other  obese  Renal/GU      Musculoskeletal   Abdominal   Peds  Hematology   Anesthesia Other Findings   Reproductive/Obstetrics                             Anesthesia Physical Anesthesia Plan  ASA: II  Anesthesia Plan: MAC   Post-op Pain Management:    Induction: Intravenous  Airway Management Planned: Simple Face Mask  Additional Equipment:   Intra-op Plan:   Post-operative Plan:   Informed Consent: I have reviewed the patients History and Physical, chart, labs and discussed the procedure including the risks, benefits and alternatives for the proposed anesthesia with the patient or authorized representative who has indicated his/her understanding and acceptance.     Plan Discussed with: CRNA, Anesthesiologist and Surgeon  Anesthesia Plan Comments:         Anesthesia Quick Evaluation

## 2016-02-11 NOTE — H&P (Signed)
Procedure: Baseline screening colonoscopy  History: The patient is a 51 year old male born 1965-06-04. He is scheduled to undergo his first screening colonoscopy with polypectomy to prevent colon cancer.  Medication allergies: Sulfa causes rash  Past medical history: Cystoscopy. Gastric sleeve. Right rotator cuff surgery. Depression. Hypertension. Hypercholesterolemia. Obstructive sleep apnea syndrome. Kidney stones(calcium oxalate). Gout.  Exam: The patient is alert and lying comfortably on the endoscopy stretcher. Abdomen is soft and nontender to palpation. Lungs are clear to auscultation. Cardiac exam reveals a regular rhythm.  Plan: Proceed with baseline screening colonoscopy

## 2016-02-11 NOTE — Discharge Instructions (Signed)

## 2016-02-11 NOTE — Transfer of Care (Signed)
Immediate Anesthesia Transfer of Care Note  Patient: Nathan Moses  Procedure(s) Performed: Procedure(s): COLONOSCOPY WITH PROPOFOL (N/A)  Patient Location: PACU  Anesthesia Type:MAC  Level of Consciousness: Patient easily awoken, sedated, comfortable, cooperative, following commands, responds to stimulation.   Airway & Oxygen Therapy: Patient spontaneously breathing, ventilating well, oxygen via simple oxygen mask.  Post-op Assessment: Report given to PACU RN, vital signs reviewed and stable, moving all extremities.   Post vital signs: Reviewed and stable.  Complications: No apparent anesthesia complications  Last Vitals:  Vitals:   02/11/16 1233  BP: 137/87  Pulse: 63  Resp: 15  Temp: 36.8 C    Last Pain:  Vitals:   02/11/16 1233  TempSrc: Oral         Complications: No apparent anesthesia complications

## 2016-02-11 NOTE — Op Note (Signed)
[facility] Patient Name: Nathan Moses Procedure Date: 02/11/2016 MRN: [id number] Attending MD: Garlan Fair , MD Date of Birth: Jul 22, 1965 CSN: [id number] Age: 51 Admit Type: Outpatient Procedure:                Colonoscopy Indications:              Screening for colorectal malignant neoplasm Providers:                Garlan Fair, MD, Vista Lawman, RN, Alfonso Patten, Technician, Heide Scales, CRNA Referring MD:              Medicines:                Propofol per Anesthesia Complications:            No immediate complications. Estimated Blood Loss:     Estimated blood loss was minimal. Procedure:                Pre-Anesthesia Assessment:                           - Prior to the procedure, a History and Physical                            was performed, and patient medications and                            allergies were reviewed. The patient's tolerance of                            previous anesthesia was also reviewed. The risks                            and benefits of the procedure and the sedation                            options and risks were discussed with the patient.                            All questions were answered, and informed consent                            was obtained. Prior Anticoagulants: The patient has                            taken no previous anticoagulant or antiplatelet                            agents. ASA Grade Assessment: II - A patient with                            mild systemic disease. After reviewing the risks  and benefits, the patient was deemed in                            satisfactory condition to undergo the procedure.                           After obtaining informed consent, the colonoscope                            was passed under direct vision. Throughout the                            procedure, the patient's blood pressure, pulse, and                   oxygen saturations were monitored continuously. The                            [id number] scope was introduced through                            the anus and advanced to the the cecum, identified                            by appendiceal orifice and ileocecal valve. The                            colonoscopy was performed without difficulty. The                            patient tolerated the procedure well. The quality                            of the bowel preparation was good. The appendiceal                            orifice and the rectum were photographed. Scope In: 1:13:09 PM Scope Out: 1:29:11 PM Scope Withdrawal Time: 0 hours 10 minutes 1 second  Total Procedure Duration: 0 hours 16 minutes 2 seconds  Findings:      The perianal and digital rectal examinations were normal.      A 5 mm polyp was found in the mid transverse colon. The polyp was       sessile. The polyp was removed with a cold snare. Resection and       retrieval were complete.      The exam was otherwise without abnormality. Impression:               - One 5 mm polyp in the mid transverse colon,                            removed with a cold snare. Resected and retrieved.                           - The examination was otherwise normal. Moderate Sedation:      N/A-  Per Anesthesia Care Recommendation:           - Patient has a contact number available for                            emergencies. The signs and symptoms of potential                            delayed complications were discussed with the                            patient. Return to normal activities tomorrow.                            Written discharge instructions were provided to the                            patient.                           - Repeat colonoscopy date to be determined after                            pending pathology results are reviewed for                            surveillance.                            - Resume previous diet.                           - Continue present medications. Procedure Code(s):        --- Professional ---                           [phone], Colonoscopy, flexible; with removal of                            tumor(s), polyp(s), or other lesion(s) by snare                            technique Diagnosis Code(s):        --- Professional ---                           Z12.11, Encounter for screening for malignant                            neoplasm of colon                           D12.3, Benign neoplasm of transverse colon (hepatic                            flexure or splenic flexure) CPT copyright 2016 American Medical Association. All rights reserved. The codes documented in this report are  preliminary and upon coder review may  be revised to meet current compliance requirements. Earle Gell, MD Garlan Fair, MD 02/11/2016 1:34:16 PM This report has been signed electronically. Number of Addenda: 0

## 2016-02-12 ENCOUNTER — Encounter (HOSPITAL_COMMUNITY): Payer: Self-pay | Admitting: Gastroenterology

## 2016-02-12 NOTE — Anesthesia Postprocedure Evaluation (Signed)
Anesthesia Post Note  Patient: Nathan Moses  Procedure(Moses) Performed: Procedure(Moses) (LRB): COLONOSCOPY WITH PROPOFOL (N/A)  Patient location during evaluation: PACU Anesthesia Type: MAC Level of consciousness: awake and alert Pain management: pain level controlled Vital Signs Assessment: post-procedure vital signs reviewed and stable Respiratory status: spontaneous breathing, nonlabored ventilation, respiratory function stable and patient connected to nasal cannula oxygen Cardiovascular status: stable and blood pressure returned to baseline Anesthetic complications: no       Last Vitals:  Vitals:   02/11/16 1340 02/11/16 1350  BP: 104/66 119/85  Pulse: 69 63  Resp: 16 13  Temp:      Last Pain:  Vitals:   02/11/16 1335  TempSrc: Oral                 Nathan Moses

## 2016-04-14 DIAGNOSIS — Z23 Encounter for immunization: Secondary | ICD-10-CM | POA: Diagnosis not present

## 2016-06-17 DIAGNOSIS — G4733 Obstructive sleep apnea (adult) (pediatric): Secondary | ICD-10-CM | POA: Diagnosis not present

## 2016-08-13 ENCOUNTER — Encounter (HOSPITAL_COMMUNITY): Payer: Self-pay

## 2016-08-20 DIAGNOSIS — Z23 Encounter for immunization: Secondary | ICD-10-CM | POA: Diagnosis not present

## 2016-09-23 DIAGNOSIS — Z23 Encounter for immunization: Secondary | ICD-10-CM | POA: Diagnosis not present

## 2016-12-17 DIAGNOSIS — G4733 Obstructive sleep apnea (adult) (pediatric): Secondary | ICD-10-CM | POA: Diagnosis not present

## 2017-02-05 DIAGNOSIS — M109 Gout, unspecified: Secondary | ICD-10-CM | POA: Diagnosis not present

## 2017-02-05 DIAGNOSIS — E7800 Pure hypercholesterolemia, unspecified: Secondary | ICD-10-CM | POA: Diagnosis not present

## 2017-02-05 DIAGNOSIS — Z0000 Encounter for general adult medical examination without abnormal findings: Secondary | ICD-10-CM | POA: Diagnosis not present

## 2017-02-05 DIAGNOSIS — I10 Essential (primary) hypertension: Secondary | ICD-10-CM | POA: Diagnosis not present

## 2017-02-05 DIAGNOSIS — Z23 Encounter for immunization: Secondary | ICD-10-CM | POA: Diagnosis not present

## 2017-06-17 DIAGNOSIS — G4733 Obstructive sleep apnea (adult) (pediatric): Secondary | ICD-10-CM | POA: Diagnosis not present

## 2017-08-31 DIAGNOSIS — Z23 Encounter for immunization: Secondary | ICD-10-CM | POA: Diagnosis not present

## 2017-11-02 DIAGNOSIS — Z719 Counseling, unspecified: Secondary | ICD-10-CM | POA: Diagnosis not present

## 2017-12-17 DIAGNOSIS — G4733 Obstructive sleep apnea (adult) (pediatric): Secondary | ICD-10-CM | POA: Diagnosis not present

## 2018-01-17 DIAGNOSIS — Z23 Encounter for immunization: Secondary | ICD-10-CM | POA: Diagnosis not present

## 2018-03-01 DIAGNOSIS — M109 Gout, unspecified: Secondary | ICD-10-CM | POA: Diagnosis not present

## 2018-03-01 DIAGNOSIS — Z0000 Encounter for general adult medical examination without abnormal findings: Secondary | ICD-10-CM | POA: Diagnosis not present

## 2018-03-01 DIAGNOSIS — E7800 Pure hypercholesterolemia, unspecified: Secondary | ICD-10-CM | POA: Diagnosis not present

## 2018-03-01 DIAGNOSIS — G4730 Sleep apnea, unspecified: Secondary | ICD-10-CM | POA: Diagnosis not present

## 2019-10-16 ENCOUNTER — Emergency Department (HOSPITAL_BASED_OUTPATIENT_CLINIC_OR_DEPARTMENT_OTHER): Payer: 59

## 2019-10-16 ENCOUNTER — Encounter (HOSPITAL_BASED_OUTPATIENT_CLINIC_OR_DEPARTMENT_OTHER): Payer: Self-pay | Admitting: *Deleted

## 2019-10-16 ENCOUNTER — Other Ambulatory Visit: Payer: Self-pay

## 2019-10-16 ENCOUNTER — Emergency Department (HOSPITAL_BASED_OUTPATIENT_CLINIC_OR_DEPARTMENT_OTHER)
Admission: EM | Admit: 2019-10-16 | Discharge: 2019-10-17 | Disposition: A | Discharge status: Home / Self Care | Payer: 59 | Attending: Emergency Medicine | Admitting: Emergency Medicine

## 2019-10-16 DIAGNOSIS — Y9301 Activity, walking, marching and hiking: Secondary | ICD-10-CM | POA: Diagnosis not present

## 2019-10-16 DIAGNOSIS — W010XXA Fall on same level from slipping, tripping and stumbling without subsequent striking against object, initial encounter: Secondary | ICD-10-CM | POA: Diagnosis not present

## 2019-10-16 DIAGNOSIS — R0781 Pleurodynia: Secondary | ICD-10-CM | POA: Diagnosis not present

## 2019-10-16 DIAGNOSIS — I10 Essential (primary) hypertension: Secondary | ICD-10-CM | POA: Diagnosis not present

## 2019-10-16 DIAGNOSIS — Y9289 Other specified places as the place of occurrence of the external cause: Secondary | ICD-10-CM | POA: Diagnosis not present

## 2019-10-16 DIAGNOSIS — S4991XA Unspecified injury of right shoulder and upper arm, initial encounter: Secondary | ICD-10-CM | POA: Diagnosis not present

## 2019-10-16 MED ORDER — OXYCODONE-ACETAMINOPHEN 5-325 MG PO TABS
1.0000 | ORAL_TABLET | Freq: Once | ORAL | Status: AC
  Administered 2019-10-16: 1 via ORAL
  Filled 2019-10-16: qty 1

## 2019-10-16 MED ORDER — OXYCODONE-ACETAMINOPHEN 5-325 MG PO TABS: 1.0000 | ORAL_TABLET | ORAL | 0 refills | Status: AC | PRN

## 2019-10-16 NOTE — ED Notes (Signed)
Pt in triage room for eval by EDP

## 2019-10-16 NOTE — ED Notes (Signed)
Dr. Dykstra ED Provider at bedside. 

## 2019-10-16 NOTE — ED Provider Notes (Signed)
[place] EMERGENCY DEPARTMENT Provider Note   CSN: [id number] Arrival date & time: 10/16/19  1859     History Chief Complaint  Patient presents with  . Fall    Nathan Moses is a 54 y.o. male.  Presents to the emergency room with concern for fall, right rib pain, right shoulder pain.  Reports he was taking at grandfather Nathan Moses today when he tripped and rolled down approximately 30 feet down a hill.  Reports that he was able to ambulate without too much difficulty after the fall but was having right shoulder and right rib pain.  Reports pain aching, throbbing sensation, worse in right shoulder and right ribs.  Nonradiating.  No abdominal pain, no difficulty breathing.  Denies head trauma, denies loss of consciousness denies LOC. 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   HPI     Past Medical History:  Diagnosis Date  . Constipation   . Depression   . History of gout   . History of kidney stones   . Hypercholesteremia   . Hypertension   . Reflux   . Sleep apnea    uses c pap    Patient Active Problem List   Diagnosis Date Noted  .  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 GERD (gastroesophageal reflux disease) 01/14/2013  . Morbid obesity ([place]) 11/05/2012    Past Surgical History:  Procedure Laterality Date  . BREATH TEK H PYLORI N/A 11/19/2012   Procedure: BREATH TEK H PYLORI;  Surgeon: Pedro Earls, MD;  Location: [facility];  Service: General;  Laterality: N/A;  . BREATH TEK H PYLORI N/A 01/10/2013   Procedure: BREATH TEK H PYLORI;  Surgeon: Pedro Earls, MD;  Location: [facility];  Service: General;  Laterality: N/A;  . COLONOSCOPY WITH PROPOFOL N/A 02/11/2016   Procedure: COLONOSCOPY WITH PROPOFOL;  Surgeon: Garlan Fair, MD;  Location: [facility];  Service: Endoscopy;  Laterality: N/A;  . CYSTO    . HIATAL HERNIA REPAIR  01/18/2013   Procedure: LAPAROSCOPIC REPAIR OF HIATAL HERNIA;   Surgeon: Pedro Earls, MD;  Location: [facility];  Service: General;;  . LAPAROSCOPIC GASTRIC SLEEVE RESECTION N/A 01/18/2013   Procedure: LAPAROSCOPIC GASTRIC SLEEVE RESECTION;  Surgeon: Pedro Earls, MD;  Location: [facility];  Service: General;  Laterality: N/A;  . ROTATOR CUFF REPAIR         Family History  Problem Relation Age of Onset  . Cancer Maternal Grandmother        breast    Social History   Tobacco Use  . Smoking status: Never Smoker  . Smokeless tobacco: Never Used  Vaping Use  . Vaping Use: Never used  Substance Use Topics  . Alcohol use: Yes    Comment: occasional  . Drug use: No    Home Medications Prior to Admission medications   Medication Sig Start Date End Date Taking? Authorizing Provider  citalopram (CELEXA) 40 MG tablet Take 40 mg by mouth daily.    [provider]  Cyanocobalamin (NASCOBAL) 500 MCG/0.1ML SOLN Place 0.1 mLs into the nose every 7 (seven) days. SUNDAYS    [provider]  GAVILYTE-N WITH FLAVOR PACK 420 g solution Take 4,000 mLs by mouth once. WILL DO PRIOR TO PROCEDURE 12/13/15   [provider]  ibuprofen (ADVIL,MOTRIN) 200 MG tablet Take 400 mg by mouth every 6 (six) hours as needed for headache.    [provider]  omeprazole (PRILOSEC) 40  MG capsule Take 40 mg by mouth daily.    [provider]  oxyCODONE-acetaminophen (PERCOCET) 5-325 MG tablet Take 1 tablet by mouth every 4 (four) hours as needed for up to 3 days for severe pain. 10/16/19 10/19/19  Lucrezia Starch, MD  triamterene-hydrochlorothiazide (MAXZIDE-25) 37.5-25 MG tablet Take 1 tablet by mouth daily.  12/05/15   [provider]    Allergies    Sulfa antibiotics  Review of Systems   Review of Systems  Constitutional: Negative for chills and fever.  HENT: Negative for ear pain and sore throat.   Eyes: Negative for pain and visual disturbance.  Respiratory: Negative for cough and shortness of breath.     Cardiovascular: Positive for chest pain. Negative for palpitations.  Gastrointestinal: Negative for abdominal pain and vomiting.  Genitourinary: Negative for dysuria and hematuria.  Musculoskeletal: Positive for arthralgias. Negative for back pain.  Skin: Negative for color change and rash.  Neurological: Negative for seizures and syncope.  All other systems reviewed and are negative.   Physical Exam Updated Vital Signs BP (!) 135/99 (BP Location: Right Arm)   Pulse 61   Temp 97.7 F (36.5 C) (Oral)   Resp 19   Ht 5\' 8"  (1.727 m)   Wt 113.4 kg   SpO2 100%   BMI 38.01 kg/m   Physical Exam Vitals and nursing note reviewed.  Constitutional:      Appearance: He is well-developed.  HENT:     Head: Normocephalic and atraumatic.  Eyes:     Conjunctiva/sclera: Conjunctivae normal.  Cardiovascular:     Rate and Rhythm: Normal rate and regular rhythm.     Heart sounds: No murmur heard.   Pulmonary:     Effort: Pulmonary effort is normal. No respiratory distress.     Breath sounds: Normal breath sounds.  Chest:     Comments: TTP over right anterior and lateral chest wall Abdominal:     Palpations: Abdomen is soft.     Tenderness: There is no abdominal tenderness.  Musculoskeletal:     Cervical back: Neck supple.     Comments: Back: no C, T, L spine TTP, no step off or deformity RUE: TTP noted over R shoulder, there is no tinting of skin, shoulder ROM reduced 2/2 pain, otherwise no other TTP, no deformity, radial pulse intact, distal sensation and motor intact LUE: no TTP throughout, no deformity, normal joint ROM, radial pulse intact, distal sensation and motor intact RLE:  no TTP throughout, no deformity, normal joint ROM, distal pulse, sensation and motor intact LLE: no TTP throughout, no deformity, normal joint ROM, distal pulse, sensation and motor intact  Skin:    General: Skin is warm and dry.  Neurological:     Mental Status: He is alert.     ED Results /  Procedures / Treatments   Labs (all labs ordered are listed, but only abnormal results are displayed) Labs Reviewed - No data to display  EKG None  Radiology DG Ribs Unilateral W/Chest Right  Result Date: 10/16/2019 CLINICAL DATA:  Shoulder pain, right rib pain after fall, rolled 30 feet after fall hiking EXAM: RIGHT RIBS AND CHEST - 3+ VIEW COMPARISON:  Chest radiograph 11/22/2012, contemporary shoulder radiographs FINDINGS: No visible displaced rib fracture. No other acute traumatic abnormality of the chest wall. Some mild elevation of the distal right clavicle relative to the acromion is better seen on contemporary shoulder radiographs. No consolidation, features of edema, pneumothorax, or effusion. Chronic elevation of the right  hemidiaphragm, unchanged. The cardiomediastinal contours are unremarkable. IMPRESSION: 1. No visible displaced rib fracture or other acute cardiopulmonary abnormality. 2. Chronic elevation of the right hemidiaphragm. Electronically Signed   By: Lovena Le M.D.   On: 10/16/2019 23:07   DG Shoulder Right  Result Date: 10/16/2019 CLINICAL DATA:  [place] and rolled 30 feet while hiking, right shoulder pain with decreased range of motion EXAM: RIGHT SHOULDER - 2+ VIEW COMPARISON:  Concurrent rib radiographs, right shoulder MRI 10/05/2011 FINDINGS: Mild elevation of the distal clavicle relative to the acromion with adjacent soft tissue swelling and thickening suggestive of a [place] type 2 acromioclavicular separation. No other acute fracture or traumatic osseous injury. Moderate arthrosis about the glenohumeral joint with only minimal acromioclavicular arthrosis. Slightly high-riding appearance of the humeral head may reflect some rotator cuff insufficiency also suspected on comparison MRI. Included portions of the chest are better detailed on dedicated, concurrent rib radiographs. IMPRESSION: 1. Mild elevation of the distal clavicle relative to the acromion with adjacent  soft tissue swelling and thickening suggestive of a [place] type 2 acromioclavicular separation. Correlate for point tenderness. 2. 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 Electronically Signed   By: Lovena Le M.D.   On: 10/16/2019 23:05    Procedures Procedures (including critical care time)  Medications Ordered in ED Medications  oxyCODONE-acetaminophen (PERCOCET/ROXICET) 5-325 MG per tablet 1 tablet (1 tablet Oral Given 10/16/19 2258)    ED Course  I have reviewed the triage vital signs and the nursing notes.  Pertinent labs & imaging results that were available during my care of the patient were reviewed by me and considered in my medical decision making (see chart for details).    MDM Rules/Calculators/A&P                          54 year old male presents to ER after mechanical fall while hiking today complaining of primarily right shoulder and right chest wall pain.  On physical exam tenderness over his right shoulder and right chest wall.  Chest/rib x-ray was negative, shoulder x-ray concerning for [place] type II AC separation.  Patient does have point tenderness here, no skin tenting.  He is neurovascularly intact.  Will place in sling, recommend nonweightbearing for now and follow-up with orthopedic surgery or sports medicine.  Pain controlled discharge home.   After the discussed management above, the patient was determined to be safe for discharge.  The patient was in agreement with this plan and all questions regarding their care were answered.  ED return precautions were discussed and the patient will return to the ED with any significant worsening of condition.   Final Clinical Impression(s) / ED Diagnoses Final diagnoses:  Injury of right acromioclavicular joint, initial encounter    Rx / DC Orders ED Discharge Orders         Ordered    oxyCODONE-acetaminophen (PERCOCET) 5-325 MG tablet  Every 4 hours PRN         10/16/19 2339           Lucrezia Starch, MD 10/17/19 1013

## 2019-10-16 NOTE — Discharge Instructions (Signed)
The x-ray of your shoulder shows an injury to your acromioclavicular joint of your right shoulder.  Please use sling, recommend no weightbearing of your right arm for now.  Please follow-up with orthopedic surgery this week for further management and evaluation.  Recommend Tylenol and Motrin as needed for pain control.  You can take Percocet as prescribed as needed for breakthrough pain.  If you develop uncontrolled pain, chest pain, difficulty in breathing or other new concerning symptom, return to ER for reassessment.

## 2019-10-16 NOTE — ED Triage Notes (Signed)
Pt reports he tripped and rolled 25-35 feet [place]. States he rolled several times. No LOC. C/o pain in right shoulder, right hip, right elbow and right ribs. Cao x 4

## 2019-10-16 NOTE — ED Notes (Signed)
Ice pack applied.

## 2019-10-19 ENCOUNTER — Ambulatory Visit: Payer: Self-pay

## 2019-10-19 ENCOUNTER — Ambulatory Visit (INDEPENDENT_AMBULATORY_CARE_PROVIDER_SITE_OTHER): Payer: 59 | Admitting: Family Medicine

## 2019-10-19 ENCOUNTER — Ambulatory Visit (HOSPITAL_BASED_OUTPATIENT_CLINIC_OR_DEPARTMENT_OTHER)
Admission: RE | Admit: 2019-10-19 | Discharge: 2019-10-19 | Disposition: A | Discharge status: Home / Self Care | Payer: 59 | Source: Ambulatory Visit | Attending: Family Medicine | Admitting: Family Medicine

## 2019-10-19 ENCOUNTER — Encounter: Payer: Self-pay | Admitting: Family Medicine

## 2019-10-19 ENCOUNTER — Other Ambulatory Visit: Payer: Self-pay

## 2019-10-19 VITALS — BP 135/89 | HR 61 | Ht 69.0 in | Wt 250.0 lb

## 2019-10-19 DIAGNOSIS — S46319A Strain of muscle, fascia and tendon of triceps, unspecified arm, initial encounter: Secondary | ICD-10-CM

## 2019-10-19 DIAGNOSIS — S46319D Strain of muscle, fascia and tendon of triceps, unspecified arm, subsequent encounter: Secondary | ICD-10-CM | POA: Insufficient documentation

## 2019-10-19 DIAGNOSIS — M25521 Pain in right elbow: Secondary | ICD-10-CM | POA: Diagnosis not present

## 2019-10-19 DIAGNOSIS — S4351XA Sprain of right acromioclavicular joint, initial encounter: Secondary | ICD-10-CM | POA: Diagnosis not present

## 2019-10-19 NOTE — Assessment & Plan Note (Signed)
There is redness and tenderness of the olecranon. No bursitis observed but changes of the triceps at the insertion.  - counseled on sling  - provided duexis samples  - xray  - could consider further imaging.

## 2019-10-19 NOTE — Assessment & Plan Note (Signed)
Injury on 10/10. No pain on palpation today  - counseled on supportive care and home exercise therapy - could consider injection or physical therapy

## 2019-10-19 NOTE — Patient Instructions (Signed)
Nice to meet you Please use ice  Please use the sling for comfort.  I will call with the xray results from today  Please take the dueixs.  Please try the range of motion movements.  Please send me a message in MyChart with any questions or updates.  Please see me back in 2 weeks.   --Dr. Raeford Razor

## 2019-10-19 NOTE — Progress Notes (Signed)
Nathan Moses - 54 y.o. male MRN [id number]  Date of birth: 02-18-1965  SUBJECTIVE:  Including CC & ROS.  Chief Complaint  Patient presents with  . Shoulder Injury    right x 10/16/2019    Nathan Moses is a 54 y.o. male that is presenting with right shoulder pain as well as right elbow pain.  He had a fall about 3 days ago.  He did somersaults down the hill.  He was seen in the emergency department provided a sling.  His elbow was painful as well as his ribs.  Only took a couple of the pain medications.  Symptoms are intermittent in severity..  Independent review of the right shoulder x-ray from 10/10 elevation of the [facility] joint as well as degenerative changes of the glenohumeral joint.   Review of Systems See HPI   HISTORY: Past Medical, Surgical, Social, and Family History Reviewed & Updated per EMR.   Pertinent Historical Findings include:  Past Medical History:  Diagnosis Date  . Constipation   . Depression   . History of gout   . History of kidney stones   . Hypercholesteremia   . Hypertension   . Reflux   . Sleep apnea    uses c pap    Past Surgical History:  Procedure Laterality Date  . BREATH TEK H PYLORI N/A 11/19/2012   Procedure: BREATH TEK H PYLORI;  Surgeon: Pedro Earls, MD;  Location: [facility];  Service: General;  Laterality: N/A;  . BREATH TEK H PYLORI N/A 01/10/2013   Procedure: BREATH TEK H PYLORI;  Surgeon: Pedro Earls, MD;  Location: [facility];  Service: General;  Laterality: N/A;  . COLONOSCOPY WITH PROPOFOL N/A 02/11/2016   Procedure: COLONOSCOPY WITH PROPOFOL;  Surgeon: Garlan Fair, MD;  Location: [facility];  Service: Endoscopy;  Laterality: N/A;  . CYSTO    . HIATAL HERNIA REPAIR  01/18/2013   Procedure: LAPAROSCOPIC REPAIR OF HIATAL HERNIA;  Surgeon: Pedro Earls, MD;  Location: [facility];  Service: General;;  . LAPAROSCOPIC GASTRIC SLEEVE RESECTION N/A 01/18/2013   Procedure: LAPAROSCOPIC GASTRIC SLEEVE RESECTION;  Surgeon:  Pedro Earls, MD;  Location: [facility];  Service: General;  Laterality: N/A;  . ROTATOR CUFF REPAIR      Family History  Problem Relation Age of Onset  . Cancer Maternal Grandmother        breast    Social History   Socioeconomic History  . Marital status: Married    Spouse name: Not on file  . Number of children: Not on file  . Years of education: Not on file  . Highest education level: Not on file  Occupational History  . Not on file  Tobacco Use  . Smoking status: Never Smoker  . Smokeless tobacco: Never Used  Vaping Use  . Vaping Use: Never used  Substance and Sexual Activity  . Alcohol use: Yes    Comment: occasional  . Drug use: No  . Sexual activity: Not on file  Other Topics Concern  . Not on file  Social History Narrative  . Not on file   Social Determinants of Health   Financial Resource Strain:   . Difficulty of Paying Living Expenses: Not on file  Food Insecurity:   . Worried About Charity fundraiser in the Last Year: Not on file  . Ran Out of Food in the Last Year: Not on file  Transportation Needs:   . Lack of Transportation (Medical): Not on  file  . Lack of Transportation (Non-Medical): Not on file  Physical Activity:   . Days of Exercise per Week: Not on file  . Minutes of Exercise per Session: Not on file  Stress:   . Feeling of Stress : Not on file  Social Connections:   . Frequency of Communication with Friends and Family: Not on file  . Frequency of Social Gatherings with Friends and Family: Not on file  . Attends Religious Services: Not on file  . Active Member of Clubs or Organizations: Not on file  . Attends Archivist Meetings: Not on file  . Marital Status: Not on file  Intimate Partner Violence:   . Fear of Current or Ex-Partner: Not on file  . Emotionally Abused: Not on file  . Physically Abused: Not on file  . Sexually Abused: Not on file     PHYSICAL EXAM:  VS: BP 135/89   Pulse 61   Ht 5\' 9"  (1.753 m)   Wt  250 lb (113.4 kg)   BMI 36.92 kg/m  Physical Exam Gen: NAD, alert, cooperative with exam, well-appearing MSK:  Right shoulder: No tenderness palpation of the AC joint or proximal humerus. Limited active abduction and flexion. Right elbow: Redness and tenderness of the olecranon. Limited extension and flexion. Normal grip strength. Neurovascularly intact  Limited ultrasound: Right elbow:  Normal insertion of the triceps into the olecranon. No changes of the cortex of the olecranon. Increased hyperemia seen in long and short axis at the insertion of the triceps.  This appears to be irritation of the matrix at the insertion. No cobblestoning of the soft tissue. No effusion of the joint space. Normal-appearing lateral and medial epicondyles.  Summary: Findings suggest irritation of the insertion of the triceps tendon  Ultrasound and interpretation by Clearance Coots, MD    ASSESSMENT & PLAN:   Sprain of acromioclavicular joint, right, initial encounter Injury on 10/10. No pain on palpation today  - counseled on supportive care and home exercise therapy - could consider injection or physical therapy   Triceps strain, initial encounter There is redness and tenderness of the olecranon. No bursitis observed but changes of the triceps at the insertion.  - counseled on sling  - provided duexis samples  - xray  - could consider further imaging.

## 2019-10-19 NOTE — Progress Notes (Signed)
Medication Samples have been provided to the patient.  Drug name: Duexis       Strength: 800mg /26.6mg         Qty: 2 boxes  LOT: [id number]  Exp.Date: 04/2020  Dosing instructions: Take 1 tablet by mouth three (3) times a day.  The patient has been instructed regarding the correct time, dose, and frequency of taking this medication, including desired effects and most common side effects.   Sherrie George, [place] 11:38 AM 10/19/2019

## 2019-10-20 ENCOUNTER — Telehealth: Payer: Self-pay | Admitting: Family Medicine

## 2019-10-20 NOTE — Telephone Encounter (Signed)
Informed of xray results and questions about medications.   Rosemarie Ax, MD [facility] 10/20/2019, 11:23 AM

## 2019-10-20 NOTE — Telephone Encounter (Signed)
Patient called states he woke up this morning still in pain, and wonders if he can/ should take the pain meds the ED doctor gave him ??? or just take the Rx from Dr. Raeford Razor ??  ---Forwarding message to provider for review & instructions-- Patient is requesting a call back @ [phone]  --glh

## 2019-10-27 ENCOUNTER — Telehealth: Payer: Self-pay | Admitting: Family Medicine

## 2019-10-27 ENCOUNTER — Other Ambulatory Visit: Payer: Self-pay

## 2019-10-27 ENCOUNTER — Ambulatory Visit: Payer: 59 | Admitting: Family Medicine

## 2019-10-27 ENCOUNTER — Encounter: Payer: Self-pay | Admitting: Family Medicine

## 2019-10-27 DIAGNOSIS — S46319D Strain of muscle, fascia and tendon of triceps, unspecified arm, subsequent encounter: Secondary | ICD-10-CM | POA: Diagnosis not present

## 2019-10-27 NOTE — Assessment & Plan Note (Signed)
Pain is occurring and worse with activity. Doesn't appear to be infected.  -Counseled on home exercise therapy and supportive care. -Splint today.  Counseled on discontinuation. -May need consider further imaging or antibiotics.

## 2019-10-27 NOTE — Progress Notes (Signed)
Jaqwon Manfred - 54 y.o. male MRN [id number]  Date of birth: 1965/11/04  SUBJECTIVE:  Including CC & ROS.  Chief Complaint  Patient presents with  . Follow-up    right arm    Cleland Simkins is a 54 y.o. male that is presenting with ongoing right elbow pain. His pain did respond with the ibuprofen but is exacerbated by activity. Still tender at the olecranon.    Review of Systems See HPI   HISTORY: Past Medical, Surgical, Social, and Family History Reviewed & Updated per EMR.   Pertinent Historical Findings include:  Past Medical History:  Diagnosis Date  . Constipation   . Depression   . History of gout   . History of kidney stones   . Hypercholesteremia   . Hypertension   . Reflux   . Sleep apnea    uses c pap    Past Surgical History:  Procedure Laterality Date  . BREATH TEK H PYLORI N/A 11/19/2012   Procedure: BREATH TEK H PYLORI;  Surgeon: Pedro Earls, MD;  Location: [facility];  Service: General;  Laterality: N/A;  . BREATH TEK H PYLORI N/A 01/10/2013   Procedure: BREATH TEK H PYLORI;  Surgeon: Pedro Earls, MD;  Location: [facility];  Service: General;  Laterality: N/A;  . COLONOSCOPY WITH PROPOFOL N/A 02/11/2016   Procedure: COLONOSCOPY WITH PROPOFOL;  Surgeon: Garlan Fair, MD;  Location: [facility];  Service: Endoscopy;  Laterality: N/A;  . CYSTO    . HIATAL HERNIA REPAIR  01/18/2013   Procedure: LAPAROSCOPIC REPAIR OF HIATAL HERNIA;  Surgeon: Pedro Earls, MD;  Location: [facility];  Service: General;;  . LAPAROSCOPIC GASTRIC SLEEVE RESECTION N/A 01/18/2013   Procedure: LAPAROSCOPIC GASTRIC SLEEVE RESECTION;  Surgeon: Pedro Earls, MD;  Location: [facility];  Service: General;  Laterality: N/A;  . ROTATOR CUFF REPAIR      Family History  Problem Relation Age of Onset  . Cancer Maternal Grandmother        breast    Social History   Socioeconomic History  . Marital status: Married    Spouse name: Not on file  . Number of children: Not  on file  . Years of education: Not on file  . Highest education level: Not on file  Occupational History  . Not on file  Tobacco Use  . Smoking status: Never Smoker  . Smokeless tobacco: Never Used  Vaping Use  . Vaping Use: Never used  Substance and Sexual Activity  . Alcohol use: Yes    Comment: occasional  . Drug use: No  . Sexual activity: Not on file  Other Topics Concern  . Not on file  Social History Narrative  . Not on file   Social Determinants of Health   Financial Resource Strain:   . Difficulty of Paying Living Expenses: Not on file  Food Insecurity:   . Worried About Charity fundraiser in the Last Year: Not on file  . Ran Out of Food in the Last Year: Not on file  Transportation Needs:   . Lack of Transportation (Medical): Not on file  . Lack of Transportation (Non-Medical): Not on file  Physical Activity:   . Days of Exercise per Week: Not on file  . Minutes of Exercise per Session: Not on file  Stress:   . Feeling of Stress : Not on file  Social Connections:   . Frequency of Communication with Friends and Family: Not on file  . Frequency  of Social Gatherings with Friends and Family: Not on file  . Attends Religious Services: Not on file  . Active Member of Clubs or Organizations: Not on file  . Attends Archivist Meetings: Not on file  . Marital Status: Not on file  Intimate Partner Violence:   . Fear of Current or Ex-Partner: Not on file  . Emotionally Abused: Not on file  . Physically Abused: Not on file  . Sexually Abused: Not on file     PHYSICAL EXAM:  VS: BP 116/80   Pulse 68   Ht 5\' 9"  (1.753 m)   Wt 250 lb (113.4 kg)   BMI 36.92 kg/m  Physical Exam Gen: NAD, alert, cooperative with exam, well-appearing MSK:  Right elbow:  Tenderness and redness and swelling at the triceps insertion of the olecranon. Normal elbow range of motion. Normal strength resistance. Neurovascularly intact  1. Elbow  2. Right  3. Posterior  long arm splint  4. Ortho-glass 5. Applied by Dr. Raeford Razor     ASSESSMENT & PLAN:   Triceps strain, subsequent encounter Pain is occurring and worse with activity. Doesn't appear to be infected.  -Counseled on home exercise therapy and supportive care. -Splint today.  Counseled on discontinuation. -May need consider further imaging or antibiotics.

## 2019-10-27 NOTE — Telephone Encounter (Signed)
Left VM for patient. If he calls back please have him speak with a nurse/CMA and inform that we can make a splint if his pain isn't improved. If it's still red and tender, we may consider a short course of antibiotics. .   If any questions then please take the best time and phone number to call and I will try to call him back.   Rosemarie Ax, MD [facility] 10/27/2019, 8:15 AM

## 2019-11-02 ENCOUNTER — Ambulatory Visit: Payer: 59 | Admitting: Family Medicine

## 2019-12-09 ENCOUNTER — Ambulatory Visit: Payer: 59 | Attending: Internal Medicine

## 2019-12-09 DIAGNOSIS — Z23 Encounter for immunization: Secondary | ICD-10-CM

## 2019-12-09 NOTE — Progress Notes (Signed)
   Covid-19 Vaccination Clinic  Name:  Nathan Moses    MRN: [id number] DOB: Jul 04, 1965  12/09/2019  Mr. [name] was observed post Covid-19 immunization for 15 minutes without incident. He was provided with Vaccine Information Sheet and instruction to access the V-Safe system.   Mr. [name] was instructed to call 911 with any severe reactions post vaccine: Marland Kitchen Difficulty breathing  . Swelling of face and throat  . A fast heartbeat  . A bad rash all over body  . Dizziness and weakness   Immunizations Administered    Name Date Dose VIS Date Route   Pfizer COVID-19 Vaccine 12/09/2019  2:50 PM 0.3 mL 10/26/2019 Intramuscular   Manufacturer: [place]   Lot: [id number]   NDC: [address]-9

## 2022-04-21 ENCOUNTER — Encounter: Payer: Self-pay | Admitting: *Deleted

## 2022-08-08 ENCOUNTER — Other Ambulatory Visit (HOSPITAL_COMMUNITY): Payer: Self-pay | Admitting: Internal Medicine

## 2022-08-08 ENCOUNTER — Ambulatory Visit (HOSPITAL_COMMUNITY)
Admission: RE | Admit: 2022-08-08 | Discharge: 2022-08-08 | Disposition: A | Discharge status: Home / Self Care | Payer: 59 | Source: Ambulatory Visit | Attending: Internal Medicine | Admitting: Internal Medicine

## 2022-08-08 DIAGNOSIS — M79669 Pain in unspecified lower leg: Secondary | ICD-10-CM | POA: Diagnosis not present

## 2022-08-08 DIAGNOSIS — M79662 Pain in left lower leg: Secondary | ICD-10-CM

## 2022-08-08 NOTE — Progress Notes (Signed)
Lower extremity venous left study completed.  Preliminary results relayed to Zollie Scale for Pol[place], MD. Provider office to call patient directly to follow up.   See CV Proc for preliminary results report.   Jean Rosenthal, RDMS, RVT
